# Patient Record
Sex: Male | Born: 1960 | Hispanic: No | Marital: Married | Smoking: Never smoker
Health system: Southern US, Community
[De-identification: ages and names within clinical notes are randomized; demographics above are authoritative.]

## PROBLEM LIST (undated history)

## (undated) DIAGNOSIS — T7840XA Allergy, unspecified, initial encounter: Secondary | ICD-10-CM

## (undated) DIAGNOSIS — E785 Hyperlipidemia, unspecified: Secondary | ICD-10-CM

## (undated) DIAGNOSIS — R0683 Snoring: Secondary | ICD-10-CM

## (undated) HISTORY — DX: Allergy, unspecified, initial encounter: T78.40XA

## (undated) HISTORY — DX: Snoring: R06.83

## (undated) HISTORY — DX: Hyperlipidemia, unspecified: E78.5

---

## 2007-09-18 ENCOUNTER — Ambulatory Visit: Payer: Self-pay | Admitting: Internal Medicine

## 2007-09-18 DIAGNOSIS — J310 Chronic rhinitis: Secondary | ICD-10-CM

## 2007-09-18 DIAGNOSIS — B351 Tinea unguium: Secondary | ICD-10-CM

## 2007-09-29 ENCOUNTER — Ambulatory Visit: Payer: Self-pay | Admitting: Internal Medicine

## 2007-09-29 LAB — CONVERTED CEMR LAB
ALT: 25 units/L (ref 0–53)
Basophils Absolute: 0 10*3/uL (ref 0.0–0.1)
Basophils Relative: 0.7 % (ref 0.0–1.0)
Bilirubin Urine: NEGATIVE
CO2: 30 meq/L (ref 19–32)
Calcium: 9.2 mg/dL (ref 8.4–10.5)
Cholesterol: 195 mg/dL (ref 0–200)
Creatinine, Ser: 0.8 mg/dL (ref 0.4–1.5)
GFR calc Af Amer: 132 mL/min
HCT: 42.2 % (ref 39.0–52.0)
HDL: 45 mg/dL (ref >39.0)
Hemoglobin: 14.7 g/dL (ref 13.0–17.0)
Ketones, ur: NEGATIVE mg/dL
Lymphocytes Relative: 30.2 % (ref 12.0–46.0)
MCHC: 34.9 g/dL (ref 30.0–36.0)
MCV: 90.7 fL (ref 78.0–100.0)
Monocytes Absolute: 0.5 10*3/uL (ref 0.1–1.0)
Neutro Abs: 3.7 10*3/uL (ref 1.4–7.7)
RBC: 4.65 M/uL (ref 4.22–5.81)
RDW: 11.8 % (ref 11.5–14.6)
Sodium: 140 meq/L (ref 135–145)
Specific Gravity, Urine: 1.025 (ref 1.000–1.03)
TSH: 1.27 microintl units/mL (ref 0.35–5.50)
Total Bilirubin: 0.9 mg/dL (ref 0.3–1.2)
Total CHOL/HDL Ratio: 4.3
Total Protein, Urine: NEGATIVE mg/dL
Total Protein: 7.7 g/dL (ref 6.0–8.3)
Triglycerides: 84 mg/dL (ref 0–149)
Urine Glucose: NEGATIVE mg/dL
pH: 6 (ref 5.0–8.0)

## 2008-04-01 ENCOUNTER — Ambulatory Visit: Payer: Self-pay | Admitting: Internal Medicine

## 2008-04-01 DIAGNOSIS — L659 Nonscarring hair loss, unspecified: Secondary | ICD-10-CM | POA: Insufficient documentation

## 2008-04-01 DIAGNOSIS — E786 Lipoprotein deficiency: Secondary | ICD-10-CM | POA: Insufficient documentation

## 2008-04-01 DIAGNOSIS — J309 Allergic rhinitis, unspecified: Secondary | ICD-10-CM | POA: Insufficient documentation

## 2008-10-21 ENCOUNTER — Ambulatory Visit: Payer: Self-pay | Admitting: Internal Medicine

## 2008-10-21 LAB — CONVERTED CEMR LAB
BUN: 17 mg/dL (ref 6–23)
Basophils Absolute: 0 10*3/uL (ref 0.0–0.1)
Bilirubin, Direct: 0.2 mg/dL (ref 0.0–0.3)
CO2: 31 meq/L (ref 19–32)
Calcium: 9.2 mg/dL (ref 8.4–10.5)
Chloride: 105 meq/L (ref 96–112)
Cholesterol: 204 mg/dL — ABNORMAL HIGH (ref 0–200)
Creatinine, Ser: 0.7 mg/dL (ref 0.4–1.5)
Eosinophils Absolute: 0.1 10*3/uL (ref 0.0–0.7)
Hemoglobin, Urine: NEGATIVE
MCHC: 35.1 g/dL (ref 30.0–36.0)
MCV: 90.8 fL (ref 78.0–100.0)
Monocytes Absolute: 0.6 10*3/uL (ref 0.1–1.0)
Neutrophils Relative %: 54.3 % (ref 43.0–77.0)
Nitrite: NEGATIVE
PSA: 0.71 ng/mL (ref 0.10–4.00)
Platelets: 233 10*3/uL (ref 150.0–400.0)
Specific Gravity, Urine: 1.015 (ref 1.000–1.030)
Total Bilirubin: 1.1 mg/dL (ref 0.3–1.2)
Total Protein, Urine: NEGATIVE mg/dL
Triglycerides: 109 mg/dL (ref 0.0–149.0)
VLDL: 21.8 mg/dL (ref 0.0–40.0)
pH: 7 (ref 5.0–8.0)

## 2009-03-29 ENCOUNTER — Encounter (INDEPENDENT_AMBULATORY_CARE_PROVIDER_SITE_OTHER): Payer: Self-pay | Admitting: *Deleted

## 2009-04-21 ENCOUNTER — Encounter: Payer: Self-pay | Admitting: Internal Medicine

## 2010-10-05 ENCOUNTER — Other Ambulatory Visit (INDEPENDENT_AMBULATORY_CARE_PROVIDER_SITE_OTHER): Payer: BC Managed Care – PPO

## 2010-10-05 ENCOUNTER — Encounter: Payer: Self-pay | Admitting: Internal Medicine

## 2010-10-05 DIAGNOSIS — Z0000 Encounter for general adult medical examination without abnormal findings: Secondary | ICD-10-CM

## 2010-10-05 DIAGNOSIS — Z0389 Encounter for observation for other suspected diseases and conditions ruled out: Secondary | ICD-10-CM

## 2010-10-05 LAB — CBC WITH DIFFERENTIAL/PLATELET
Basophils Absolute: 0 10*3/uL (ref 0.0–0.1)
Basophils Relative: 0.3 % (ref 0.0–3.0)
Eosinophils Relative: 1.7 % (ref 0.0–5.0)
HCT: 44.3 % (ref 39.0–52.0)
Hemoglobin: 15.2 g/dL (ref 13.0–17.0)
Lymphocytes Relative: 38.4 % (ref 12.0–46.0)
Lymphs Abs: 2.3 10*3/uL (ref 0.7–4.0)
Monocytes Relative: 10.8 % (ref 3.0–12.0)
Neutro Abs: 2.9 10*3/uL (ref 1.4–7.7)
RBC: 4.79 Mil/uL (ref 4.22–5.81)
RDW: 12.6 % (ref 11.5–14.6)
WBC: 6 10*3/uL (ref 4.5–10.5)

## 2010-10-05 LAB — BASIC METABOLIC PANEL
Calcium: 9.1 mg/dL (ref 8.4–10.5)
GFR: 104.74 mL/min (ref >60.00)
Glucose, Bld: 90 mg/dL (ref 70–99)
Potassium: 4.7 mEq/L (ref 3.5–5.1)
Sodium: 138 mEq/L (ref 135–145)

## 2010-10-05 LAB — HEPATIC FUNCTION PANEL
AST: 29 U/L (ref 0–37)
Albumin: 3.6 g/dL (ref 3.5–5.2)
Alkaline Phosphatase: 71 U/L (ref 39–117)
Bilirubin, Direct: 0 mg/dL (ref 0.0–0.3)

## 2010-10-05 LAB — LIPID PANEL
HDL: 49.6 mg/dL (ref >39.00)
Total CHOL/HDL Ratio: 4
VLDL: 15.4 mg/dL (ref 0.0–40.0)

## 2010-10-05 LAB — URINALYSIS
Ketones, ur: NEGATIVE
Specific Gravity, Urine: 1.02 (ref 1.000–1.030)
Total Protein, Urine: NEGATIVE
Urine Glucose: NEGATIVE
Urobilinogen, UA: 0.2 (ref 0.0–1.0)
pH: 7 (ref 5.0–8.0)

## 2010-10-05 LAB — TSH: TSH: 1.42 u[IU]/mL (ref 0.35–5.50)

## 2010-10-09 ENCOUNTER — Encounter: Payer: Self-pay | Admitting: Internal Medicine

## 2010-10-09 ENCOUNTER — Ambulatory Visit (INDEPENDENT_AMBULATORY_CARE_PROVIDER_SITE_OTHER): Payer: BC Managed Care – PPO | Admitting: Internal Medicine

## 2010-10-09 VITALS — BP 112/80 | HR 80 | Temp 97.3°F | Resp 16 | Ht 68.75 in | Wt 174.0 lb

## 2010-10-09 DIAGNOSIS — Z0000 Encounter for general adult medical examination without abnormal findings: Secondary | ICD-10-CM | POA: Insufficient documentation

## 2010-10-09 DIAGNOSIS — B001 Herpesviral vesicular dermatitis: Secondary | ICD-10-CM | POA: Insufficient documentation

## 2010-10-09 DIAGNOSIS — B009 Herpesviral infection, unspecified: Secondary | ICD-10-CM

## 2010-10-09 DIAGNOSIS — R1031 Right lower quadrant pain: Secondary | ICD-10-CM

## 2010-10-09 MED ORDER — CICLOPIROX 8 % EX SOLN: Freq: Every day | CUTANEOUS | Status: DC

## 2010-10-09 MED ORDER — ACYCLOVIR 400 MG PO TABS: 400.0000 mg | ORAL_TABLET | Freq: Three times a day (TID) | ORAL | Status: AC

## 2010-10-09 NOTE — Assessment & Plan Note (Signed)
Colonoscopy CBC nl

## 2010-10-09 NOTE — Progress Notes (Signed)
  Subjective:    Patient ID: Jeff Le, male    DOB: August 28, 1960, 50 y.o.   MRN: [id number]  HPI   The patient is here for a wellness exam. The patient has been doing well overall without major physical or psychological issues going on lately. C/o pain in RLQ x 6 wks off and on No fever C/o LBP  Review of Systems  Constitutional: Negative for appetite change, fatigue and unexpected weight change.  HENT: Negative for nosebleeds, congestion, sore throat, sneezing, trouble swallowing and neck pain.   Eyes: Negative for itching and visual disturbance.  Respiratory: Negative for cough.   Cardiovascular: Negative for chest pain, palpitations and leg swelling.  Gastrointestinal: Negative for nausea, diarrhea, blood in stool and abdominal distention.  Genitourinary: Negative for frequency and hematuria.  Musculoskeletal: Negative for back pain, joint swelling and gait problem.  Skin: Negative for rash.  Neurological: Negative for dizziness, tremors, speech difficulty and weakness.  Psychiatric/Behavioral: Negative for sleep disturbance, dysphoric mood and agitation. The patient is not nervous/anxious.        Objective:   Physical Exam  Constitutional: He is oriented to person, place, and time. He appears well-developed and well-nourished. No distress.  HENT:  Head: Normocephalic and atraumatic.  Right Ear: External ear normal.  Left Ear: External ear normal.  Nose: Nose normal.  Mouth/Throat: Oropharynx is clear and moist. No oropharyngeal exudate.  Eyes: Conjunctivae and EOM are normal. Pupils are equal, round, and reactive to light. Right eye exhibits no discharge. Left eye exhibits no discharge. No scleral icterus.  Neck: Normal range of motion. Neck supple. No JVD present. No tracheal deviation present. No thyromegaly present.  Cardiovascular: Normal rate, regular rhythm, normal heart sounds and intact distal pulses.  Exam reveals no gallop and no friction rub.   No murmur  heard. Pulmonary/Chest: Effort normal and breath sounds normal. No stridor. No respiratory distress. He has no wheezes. He has no rales. He exhibits no tenderness.  Abdominal: Soft. Bowel sounds are normal. He exhibits no distension and no mass. There is no tenderness. There is no rebound and no guarding.  Genitourinary: Rectum normal, prostate normal and penis normal. Guaiac negative stool. No penile tenderness.  Musculoskeletal: Normal range of motion. He exhibits no edema and no tenderness.  Lymphadenopathy:    He has no cervical adenopathy.  Neurological: He is alert and oriented to person, place, and time. He has normal reflexes. No cranial nerve deficit. He exhibits normal muscle tone. Coordination normal.  Skin: Skin is warm and dry. No rash noted. He is not diaphoretic. No erythema. No pallor.  Psychiatric: He has a normal mood and affect. His behavior is normal. Judgment and thought content normal.        Lab Results  Component Value Date   WBC 6.0 10/05/2010   HGB 15.2 10/05/2010   HCT 44.3 10/05/2010   PLT 244.0 10/05/2010   CHOL 183 10/05/2010   TRIG 77.0 10/05/2010   HDL 49.60 10/05/2010   LDLDIRECT 125.4 10/21/2008   ALT 27 10/05/2010   AST 29 10/05/2010   NA 138 10/05/2010   K 4.7 10/05/2010   CL 104 10/05/2010   CREATININE 0.8 10/05/2010   BUN 18 10/05/2010   CO2 30 10/05/2010   TSH 1.42 10/05/2010   PSA 0.76 10/05/2010     Assessment & Plan:

## 2010-10-09 NOTE — Assessment & Plan Note (Signed)
We discussed age appropriate health related issues, including available/recomended screening tests and vaccinations. We discussed a need for adhering to healthy diet and exercise. Labs/EKG were reviewed/ordered. All questions were answered.   

## 2010-10-09 NOTE — Assessment & Plan Note (Signed)
Start Acyclovir prn

## 2010-11-09 ENCOUNTER — Encounter: Payer: Self-pay | Admitting: Internal Medicine

## 2010-12-20 ENCOUNTER — Encounter: Payer: Self-pay | Admitting: Internal Medicine

## 2011-08-24 ENCOUNTER — Encounter (HOSPITAL_COMMUNITY): Payer: Self-pay | Admitting: *Deleted

## 2011-08-24 ENCOUNTER — Emergency Department (HOSPITAL_COMMUNITY)
Admission: EM | Admit: 2011-08-24 | Discharge: 2011-08-24 | Disposition: A | Discharge status: Home / Self Care | Payer: BC Managed Care – PPO | Attending: Emergency Medicine | Admitting: Emergency Medicine

## 2011-08-24 DIAGNOSIS — S41109A Unspecified open wound of unspecified upper arm, initial encounter: Secondary | ICD-10-CM | POA: Insufficient documentation

## 2011-08-24 DIAGNOSIS — W540XXA Bitten by dog, initial encounter: Secondary | ICD-10-CM | POA: Insufficient documentation

## 2011-08-24 DIAGNOSIS — Y92009 Unspecified place in unspecified non-institutional (private) residence as the place of occurrence of the external cause: Secondary | ICD-10-CM | POA: Insufficient documentation

## 2011-08-24 MED ORDER — AMOXICILLIN-POT CLAVULANATE 875-125 MG PO TABS: 1.0000 | ORAL_TABLET | Freq: Two times a day (BID) | ORAL | Status: AC

## 2011-08-24 MED ORDER — TETANUS-DIPHTH-ACELL PERTUSSIS 5-2.5-18.5 LF-MCG/0.5 IM SUSP
0.5000 mL | Freq: Once | INTRAMUSCULAR | Status: AC
  Administered 2011-08-24: 0.5 mL via INTRAMUSCULAR
  Filled 2011-08-24: qty 0.5

## 2011-08-24 NOTE — ED Notes (Signed)
Rx given x1 D/c instructions reviewed w/ pt - pt denies any further questions or concerns at present.   

## 2011-08-24 NOTE — Discharge Instructions (Signed)
You have been treated for your dog bite here today. Animal Control will keep the dog in quarantine for the next 7-10 days and give you a call if you need to get rabies shots. Your tetanus has been updated today. Please take all of the antibiotic as prescribed. Keep the wound clean and dry and covered with a bandage. Use neosporin or bacitracin to the area. If you are noticing redness or drainage around the area or otherwise worsening condition, return for a recheck.  RESOURCE GUIDE  Dental Problems  Patients with Medicaid: [facility] [phone]                                           [phone] [address] Phone:  [phone]                                                  Phone:  [phone]  If unable to pay or uninsured, contact:  [facility] or [facility]. to become qualified for the adult dental clinic.  Chronic Pain Problems Contact [facility]  [phone] Patients need to be referred by their primary care doctor.  Insufficient Money for Medicine Contact [employer]:  call "211" or [facility] 7821634048.  No Primary Care Doctor Call [facility]  [phone] Other agencies that provide inexpensive medical care    [facility]  [phone]    [facility]  [phone]    [facility]  [phone]    [facility]  [phone]    [facility]  [phone]    [facility]  [phone]  Psychological Services [facility] Behavioral Health  [phone] [facility] Services  [phone] [facility]   [phone] (emergency services [phone])  Substance Abuse Resources [facility]  [phone] [facility] [phone] [facility] [phone] Floydene Flock [phone] Residential & Outpatient Substance Abuse Program  [phone]  Abuse/Neglect [facility] Child Abuse Hotline 618-018-7870 [facility] Child Abuse Hotline 904-303-2617 (After Hours)  Emergency Shelter [facility] 806-367-4681  M[facility] Homes [facility] [place] [facility] 203-642-6914 [facility] [phone]  MRSA Hotline #:   380 119 4020    [facility] Resources  [facility]     [employer]                          [facility] Dept. [address] [place]                       [address]      [address]  Patrecia Pace  Michell Heinrich Phone:  [phone]                                   Phone:  [phone]                 Phone:  [phone]  [facility] Mental Health Phone:  [phone]  [facility] Child Abuse Hotline 779 546 0538 (915) [phone] (After Hours)  Animal Bite An animal bite can result in a scratch on the skin, deep open cut, puncture of the skin, crush injury, or tearing away of the skin or a body part. Dogs are responsible for most animal bites. Children are bitten more often than adults. An animal bite can range from very mild to more serious. A small bite from your house pet is no cause for alarm. However, some animal bites can become infected or injure a bone or other tissue. You must seek medical care if:  The skin is broken and bleeding does not slow down or stop after 15 minutes.   The puncture is deep and difficult to clean (such as a cat bite).   Pain, warmth, redness, or pus develops around the wound.   The bite is from a stray animal or rodent. There may be a risk of rabies infection.   The bite is from a snake, raccoon, skunk, fox, coyote, or bat. There may be a risk of rabies infection.   The person bitten has a chronic illness such as diabetes, liver disease, or cancer, or the person takes medicine that lowers the immune system.   There is concern about the location and severity of the  bite.  It is important to clean and protect an animal bite wound right away to prevent infection. Follow these steps:  Clean the wound with plenty of water and soap.   Apply an antibiotic cream.   Apply gentle pressure over the wound with a clean towel or gauze to slow or stop bleeding.   Elevate the affected area above the heart to help stop any bleeding.   Seek medical care. Getting medical care within 8 hours of the animal bite leads to the best possible outcome.  DIAGNOSIS  Your caregiver will most likely:  Take a detailed history of the animal and the bite injury.   Perform a wound exam.   Take your medical history.  Blood tests or X-rays may be performed. Sometimes, infected bite wounds are cultured and sent to a lab to identify the infectious bacteria.  TREATMENT  Medical treatment will depend on the location and type of animal bite as well as the patient's medical history. Treatment may include:  Wound care, such as cleaning and flushing the wound with saline solution, bandaging, and elevating the affected area.   Antibiotics.   Tetanus immunization.   Rabies immunization.   Leaving the wound open to heal. This is often done with animal bites, due to the high risk of infection. However, in certain cases, wound closure with stitches, wound adhesive, skin adhesive strips, or staples may be used.  Infected bites that are left untreated may require intravenous (IV) antibiotics and surgical treatment in the hospital. HOME CARE INSTRUCTIONS  Follow your caregiver's instructions for wound care.   Take all medicines as directed.   If your caregiver prescribes antibiotics, take them as directed. Finish them even if you start to feel better.   Follow up with your caregiver for further exams  or immunizations as directed.  You may need a tetanus shot if:  You cannot remember when you had your last tetanus shot.   You have never had a tetanus shot.   The injury broke your  skin.  If you get a tetanus shot, your arm may swell, get red, and feel warm to the touch. This is common and not a problem. If you need a tetanus shot and you choose not to have one, there is a rare chance of getting tetanus. Sickness from tetanus can be serious. SEEK MEDICAL CARE IF:  You notice warmth, redness, soreness, swelling, pus discharge, or a bad smell coming from the wound.   You have a red line on the skin coming from the wound.   You have a fever, chills, or a general ill feeling.   You have nausea or vomiting.   You have continued or worsening pain.   You have trouble moving the injured part.   You have other questions or concerns.  MAKE SURE YOU:  Understand these instructions.   Will watch your condition.   Will get help right away if you are not doing well or get worse.  Document Released: 01/22/2011 Document Revised: 04/25/2011 Document Reviewed: 01/22/2011 [facility] Patient Information 2012 [place], [place].

## 2011-08-24 NOTE — ED Notes (Signed)
Pt reports approx 2030 - his neighbors dog bit him while he was walking down the street - bite is reported to be unproved - pt w/ puncture wound and scrap to left upper arm w/ a surrounding contusion. Pt unsure if dog is vaccinated however states animal control and police were contacted and dog is now quarantined. Pt denies any pain at present.

## 2011-08-24 NOTE — ED Notes (Signed)
The pt was bitten by a dog earlier today to his lt upper arm.  Is has been reported

## 2011-08-24 NOTE — ED Provider Notes (Signed)
History     CSN: [id number]  Arrival date & time 08/24/11  2121   First MD Initiated Contact with Patient 08/24/11 2234      Chief Complaint  Patient presents with  . Animal Bite    (Consider location/radiation/quality/duration/timing/severity/associated sxs/prior treatment) HPI History from patient. 51 year old male who presents after suffering a dog bite. He states that he was outside his house around 8:30 this evening when his neighbor walked by with her dog. She lost control of the dog's leash and the dog got loose. It ran toward him and bit him on the left upper arm. Bleeding was well controlled. He denies any numbness or weakness distally and has full range of motion in the arm. Last tetanus status unknown. He has not taken anything prior to arrival.   He states that animal control did take the dog, and will be observing it for the next several days - the owner stated it had been vaccinated but did not have proper documentation of this.  History reviewed. No pertinent past medical history.  History reviewed. No pertinent past surgical history.  No family history on file.  History  Substance Use Topics  . Smoking status: Never Smoker   . Smokeless tobacco: Not on file  . Alcohol Use: No      Review of Systems  Constitutional: Negative for fever, chills, activity change and appetite change.  Musculoskeletal: Negative for myalgias and arthralgias.  Skin: Positive for wound.  Neurological: Negative for weakness and numbness.  Hematological: Does not bruise/bleed easily.    Allergies  Review of patient's allergies indicates no known allergies.  Home Medications  No current outpatient prescriptions on file.  BP 122/87  Pulse 76  Temp(Src) 99.5 F (37.5 C) (Oral)  Resp 18  Wt 154 lb 5.2 oz (70 kg)  SpO2 98%  Physical Exam  Nursing note and vitals reviewed. Constitutional: He appears well-developed and well-nourished. No distress.  HENT:  Head: Normocephalic  and atraumatic.  Musculoskeletal:       Arms:      Full range of motion of left arm. Strength is 5 out of 5 on resisted flexion and extension of the elbow. Extremity is neurovascularly intact with sensory intact to light touch in the radian, median, ulnar distributions. Radial pulse intact.  Skin: Skin is warm and dry. He is not diaphoretic.       Approximately 0.75 cm puncture wound to medial upper arm with exposed subcutaneous tissue. There is a superficial laceration measuring approximately 7 cm in length which is discrete from the puncture wound and extends laterally. Bleeding is controlled. There is slight edema to the area and early ecchymoses. Wound appears clean.    ED Course  Procedures (including critical care time)  Labs Reviewed - No data to display No results found.   1. Dog bite of arm       MDM  Patient presents after suffering a dog bite. This has already been reported to animal control, which has quarantine the animal. Patient's wound was extensively cleaned with high-pressure syringe irrigation with NS which he tolerated well. Will allow the puncture wound to close by secondary intention. Discussed wound care. Tetanus updated. Prescription for Augmentin given for antibiotic coverage. He is aware that he will be contacted if he needs postexposure prophylaxis. Return precautions discussed.        Grant Fontana, [place] 08/25/11 [phone]

## 2011-08-26 ENCOUNTER — Encounter: Payer: Self-pay | Admitting: Internal Medicine

## 2011-10-24 NOTE — ED Provider Notes (Signed)
Medical screening examination/treatment/procedure(s) were performed by non-physician practitioner and as supervising physician I was immediately available for consultation/collaboration.  Doug Sou, MD 10/24/11 [phone]

## 2011-12-31 ENCOUNTER — Telehealth: Payer: Self-pay

## 2011-12-31 DIAGNOSIS — Z0000 Encounter for general adult medical examination without abnormal findings: Secondary | ICD-10-CM

## 2011-12-31 NOTE — Telephone Encounter (Signed)
CPX labs entered in Epic 

## 2011-12-31 NOTE — Telephone Encounter (Signed)
Message copied by Sandi Mealy on Tue Dec 31, 2011 10:26 AM ------      Message from: Burnett Harry      Created: Wed Dec 25, 2011 10:12 AM      Regarding: CPE 03/23/2012       [facility]

## 2012-03-23 ENCOUNTER — Encounter: Payer: Self-pay | Admitting: Internal Medicine

## 2012-03-23 ENCOUNTER — Other Ambulatory Visit (INDEPENDENT_AMBULATORY_CARE_PROVIDER_SITE_OTHER): Payer: BC Managed Care – PPO

## 2012-03-23 ENCOUNTER — Ambulatory Visit (INDEPENDENT_AMBULATORY_CARE_PROVIDER_SITE_OTHER): Payer: BC Managed Care – PPO | Admitting: Internal Medicine

## 2012-03-23 ENCOUNTER — Other Ambulatory Visit: Payer: Self-pay | Admitting: *Deleted

## 2012-03-23 VITALS — BP 112/80 | HR 84 | Temp 97.7°F | Resp 16 | Ht 68.0 in | Wt 171.0 lb

## 2012-03-23 DIAGNOSIS — Z0389 Encounter for observation for other suspected diseases and conditions ruled out: Secondary | ICD-10-CM

## 2012-03-23 DIAGNOSIS — Z0100 Encounter for examination of eyes and vision without abnormal findings: Secondary | ICD-10-CM

## 2012-03-23 DIAGNOSIS — E786 Lipoprotein deficiency: Secondary | ICD-10-CM

## 2012-03-23 DIAGNOSIS — Z1211 Encounter for screening for malignant neoplasm of colon: Secondary | ICD-10-CM

## 2012-03-23 DIAGNOSIS — Z0000 Encounter for general adult medical examination without abnormal findings: Secondary | ICD-10-CM

## 2012-03-23 DIAGNOSIS — Z23 Encounter for immunization: Secondary | ICD-10-CM

## 2012-03-23 LAB — BASIC METABOLIC PANEL
BUN: 21 mg/dL (ref 6–23)
Chloride: 101 mEq/L (ref 96–112)
GFR: 98.58 mL/min (ref >60.00)
Potassium: 4.9 mEq/L (ref 3.5–5.1)
Sodium: 137 mEq/L (ref 135–145)

## 2012-03-23 LAB — CBC WITH DIFFERENTIAL/PLATELET
Basophils Relative: 0.2 % (ref 0.0–3.0)
Eosinophils Relative: 2.6 % (ref 0.0–5.0)
HCT: 44.9 % (ref 39.0–52.0)
Lymphs Abs: 2.1 10*3/uL (ref 0.7–4.0)
Monocytes Relative: 12.1 % — ABNORMAL HIGH (ref 3.0–12.0)
Neutrophils Relative %: 49.8 % (ref 43.0–77.0)
Platelets: 231 10*3/uL (ref 150.0–400.0)
RBC: 4.87 Mil/uL (ref 4.22–5.81)
WBC: 5.9 10*3/uL (ref 4.5–10.5)

## 2012-03-23 LAB — LIPID PANEL
Cholesterol: 214 mg/dL — ABNORMAL HIGH (ref 0–200)
Total CHOL/HDL Ratio: 4
Triglycerides: 140 mg/dL (ref 0.0–149.0)
VLDL: 28 mg/dL (ref 0.0–40.0)

## 2012-03-23 LAB — URINALYSIS, ROUTINE W REFLEX MICROSCOPIC
Bilirubin Urine: NEGATIVE
Hgb urine dipstick: NEGATIVE
Ketones, ur: NEGATIVE
Nitrite: NEGATIVE
Total Protein, Urine: NEGATIVE
Urine Glucose: NEGATIVE
pH: 7.5 (ref 5.0–8.0)

## 2012-03-23 LAB — HEPATIC FUNCTION PANEL
ALT: 29 U/L (ref 0–53)
AST: 29 U/L (ref 0–37)
Alkaline Phosphatase: 70 U/L (ref 39–117)
Bilirubin, Direct: 0.1 mg/dL (ref 0.0–0.3)
Total Bilirubin: 0.7 mg/dL (ref 0.3–1.2)

## 2012-03-23 LAB — LDL CHOLESTEROL, DIRECT: Direct LDL: 136.4 mg/dL

## 2012-03-23 MED ORDER — CICLOPIROX 8 % EX SOLN: Freq: Every day | CUTANEOUS | Status: DC

## 2012-03-23 NOTE — Progress Notes (Signed)
Subjective:    Patient ID: Jeff Le, male    DOB: Sep 18, 1960, 51 y.o.   MRN: [id number]  HPI   The patient is here for a wellness exam. The patient has been doing well overall without major physical or psychological issues going on lately.    BP Readings from Last 3 Encounters:  03/23/12 112/80  08/24/11 121/84  10/09/10 112/80   Wt Readings from Last 3 Encounters:  03/23/12 171 lb (77.565 kg)  08/24/11 154 lb 5.2 oz (70 kg)  10/09/10 174 lb (78.926 kg)       Review of Systems  Constitutional: Negative for appetite change, fatigue and unexpected weight change.  HENT: Negative for nosebleeds, congestion, sore throat, sneezing, trouble swallowing and neck pain.   Eyes: Negative for itching and visual disturbance.  Respiratory: Negative for cough.   Cardiovascular: Negative for chest pain, palpitations and leg swelling.  Gastrointestinal: Negative for nausea, diarrhea, blood in stool and abdominal distention.  Genitourinary: Negative for frequency and hematuria.  Musculoskeletal: Negative for back pain, joint swelling and gait problem.  Skin: Negative for rash.  Neurological: Negative for dizziness, tremors, speech difficulty and weakness.  Psychiatric/Behavioral: Negative for sleep disturbance, dysphoric mood and agitation. The patient is not nervous/anxious.        Objective:   Physical Exam  Constitutional: He is oriented to person, place, and time. He appears well-developed and well-nourished. No distress.  HENT:  Head: Normocephalic and atraumatic.  Right Ear: External ear normal.  Left Ear: External ear normal.  Nose: Nose normal.  Mouth/Throat: Oropharynx is clear and moist. No oropharyngeal exudate.  Eyes: Conjunctivae normal and EOM are normal. Pupils are equal, round, and reactive to light. Right eye exhibits no discharge. Left eye exhibits no discharge. No scleral icterus.  Neck: Normal range of motion. Neck supple. No JVD present. No  tracheal deviation present. No thyromegaly present.  Cardiovascular: Normal rate, regular rhythm, normal heart sounds and intact distal pulses.  Exam reveals no gallop and no friction rub.   No murmur heard. Pulmonary/Chest: Effort normal and breath sounds normal. No stridor. No respiratory distress. He has no wheezes. He has no rales. He exhibits no tenderness.  Abdominal: Soft. Bowel sounds are normal. He exhibits no distension and no mass. There is no tenderness. There is no rebound and no guarding.  Genitourinary:       He declined the exam (def to GI)  Musculoskeletal: Normal range of motion. He exhibits no edema and no tenderness.  Lymphadenopathy:    He has no cervical adenopathy.  Neurological: He is alert and oriented to person, place, and time. He has normal reflexes. No cranial nerve deficit. He exhibits normal muscle tone. Coordination normal.  Skin: Skin is warm and dry. No rash noted. He is not diaphoretic. No erythema. No pallor.  Psychiatric: He has a normal mood and affect. His behavior is normal. Judgment and thought content normal.        Lab Results  Component Value Date   WBC 6.0 10/05/2010   HGB 15.2 10/05/2010   HCT 44.3 10/05/2010   PLT 244.0 10/05/2010   CHOL 183 10/05/2010   TRIG 77.0 10/05/2010   HDL 49.60 10/05/2010   LDLDIRECT 125.4 10/21/2008   ALT 27 10/05/2010   AST 29 10/05/2010   NA 138 10/05/2010   K 4.7 10/05/2010   CL 104 10/05/2010   CREATININE 0.8 10/05/2010   BUN 18 10/05/2010   CO2 30 10/05/2010   TSH 1.42 10/05/2010  PSA 0.76 10/05/2010     Assessment & Plan:

## 2012-06-22 ENCOUNTER — Telehealth: Payer: Self-pay | Admitting: Internal Medicine

## 2012-06-22 NOTE — Telephone Encounter (Signed)
Requesting a referral for a colonoscopy.

## 2012-06-23 ENCOUNTER — Encounter: Payer: Self-pay | Admitting: Gastroenterology

## 2012-06-23 NOTE — Telephone Encounter (Signed)
Referral is already in.  Called the patient to give him the number to GI to call for an appt.

## 2012-06-25 ENCOUNTER — Encounter: Payer: Self-pay | Admitting: Internal Medicine

## 2012-08-06 ENCOUNTER — Encounter: Payer: BC Managed Care – PPO | Admitting: Gastroenterology

## 2013-01-25 ENCOUNTER — Ambulatory Visit (INDEPENDENT_AMBULATORY_CARE_PROVIDER_SITE_OTHER): Payer: Managed Care, Other (non HMO) | Admitting: Internal Medicine

## 2013-01-25 ENCOUNTER — Other Ambulatory Visit (INDEPENDENT_AMBULATORY_CARE_PROVIDER_SITE_OTHER): Payer: Managed Care, Other (non HMO)

## 2013-01-25 ENCOUNTER — Encounter: Payer: Self-pay | Admitting: Internal Medicine

## 2013-01-25 ENCOUNTER — Ambulatory Visit (INDEPENDENT_AMBULATORY_CARE_PROVIDER_SITE_OTHER)
Admission: RE | Admit: 2013-01-25 | Discharge: 2013-01-25 | Disposition: A | Discharge status: Home / Self Care | Payer: Managed Care, Other (non HMO) | Source: Ambulatory Visit | Attending: Internal Medicine | Admitting: Internal Medicine

## 2013-01-25 VITALS — BP 128/90 | HR 64 | Temp 97.2°F | Wt 172.0 lb

## 2013-01-25 DIAGNOSIS — R220 Localized swelling, mass and lump, head: Secondary | ICD-10-CM

## 2013-01-25 DIAGNOSIS — R221 Localized swelling, mass and lump, neck: Secondary | ICD-10-CM

## 2013-01-25 DIAGNOSIS — M542 Cervicalgia: Secondary | ICD-10-CM | POA: Insufficient documentation

## 2013-01-25 LAB — BASIC METABOLIC PANEL
BUN: 18 mg/dL (ref 6–23)
Calcium: 9.3 mg/dL (ref 8.4–10.5)
GFR: 109.99 mL/min (ref >60.00)
Glucose, Bld: 108 mg/dL — ABNORMAL HIGH (ref 70–99)

## 2013-01-25 LAB — CBC WITH DIFFERENTIAL/PLATELET
Basophils Absolute: 0 10*3/uL (ref 0.0–0.1)
Lymphocytes Relative: 30.4 % (ref 12.0–46.0)
Lymphs Abs: 1.9 10*3/uL (ref 0.7–4.0)
Monocytes Relative: 11.6 % (ref 3.0–12.0)
Neutrophils Relative %: 56.9 % (ref 43.0–77.0)
Platelets: 239 10*3/uL (ref 150.0–400.0)
RDW: 12.8 % (ref 11.5–14.6)

## 2013-01-25 MED ORDER — IOHEXOL 300 MG/ML  SOLN
75.0000 mL | Freq: Once | INTRAMUSCULAR | Status: AC | PRN
  Administered 2013-01-25: 75 mL via INTRAVENOUS

## 2013-01-25 NOTE — Assessment & Plan Note (Signed)
9/14 There is a longitudinal mass 1.5x6 cm over L paraspinal aspect of the L nuchal area MSK vs other

## 2013-01-25 NOTE — Assessment & Plan Note (Addendum)
There is a longitudinal mass/band 1.5x6 cm over L paraspinal aspect of the L nuchal area Labs CT scan

## 2013-01-25 NOTE — Progress Notes (Signed)
Patient ID: Jeff Le, male   DOB: 1961/05/19, 52 y.o.   MRN: [id number]  Subjective:    Patient ID: Jeff Le, male    DOB: [birth year], 52 y.o.   MRN: [id number]  Neck Pain  This is a new problem. The current episode started more than 1 month ago (since Feb; found a lump last wk). The problem occurs intermittently. Associated with: noted a mass on L posterior aspect. The quality of the pain is described as aching. The pain is at a severity of 3/10. The pain is mild. Pertinent negatives include no chest pain, trouble swallowing or weakness. He has tried nothing for the symptoms. The treatment provided no relief.      BP Readings from Last 3 Encounters:  01/25/13 128/90  03/23/12 112/80  08/24/11 121/84   Wt Readings from Last 3 Encounters:  01/25/13 172 lb (78.019 kg)  03/23/12 171 lb (77.565 kg)  08/24/11 154 lb 5.2 oz (70 kg)       Review of Systems  Constitutional: Negative for appetite change, fatigue and unexpected weight change.  HENT: Positive for neck pain. Negative for nosebleeds, congestion, sore throat, sneezing and trouble swallowing.   Eyes: Negative for itching and visual disturbance.  Respiratory: Negative for cough.   Cardiovascular: Negative for chest pain, palpitations and leg swelling.  Gastrointestinal: Negative for nausea, diarrhea, blood in stool and abdominal distention.  Genitourinary: Negative for frequency and hematuria.  Musculoskeletal: Negative for back pain, joint swelling and gait problem.  Skin: Negative for rash.  Neurological: Negative for dizziness, tremors, speech difficulty and weakness.  Psychiatric/Behavioral: Negative for sleep disturbance, dysphoric mood and agitation. The patient is not nervous/anxious.        Objective:   Physical Exam  Constitutional: He is oriented to person, place, and time. He appears well-developed and well-nourished. No distress.  HENT:  Head: Normocephalic and  atraumatic.  Right Ear: External ear normal.  Left Ear: External ear normal.  Nose: Nose normal.  Mouth/Throat: Oropharynx is clear and moist. No oropharyngeal exudate.  Eyes: Conjunctivae and EOM are normal. Pupils are equal, round, and reactive to light. Right eye exhibits no discharge. Left eye exhibits no discharge. No scleral icterus.  Neck: Normal range of motion. Neck supple. No JVD present. No tracheal deviation present. No thyromegaly present.  Cardiovascular: Normal rate, regular rhythm, normal heart sounds and intact distal pulses.  Exam reveals no gallop and no friction rub.   No murmur heard. Pulmonary/Chest: Effort normal and breath sounds normal. No stridor. No respiratory distress. He has no wheezes. He has no rales. He exhibits no tenderness.  Abdominal: Soft. Bowel sounds are normal. He exhibits no distension and no mass. There is no tenderness. There is no rebound and no guarding.  Genitourinary:  He declined the exam (def to GI)  Musculoskeletal: Normal range of motion. He exhibits no edema and no tenderness.  Lymphadenopathy:    He has no cervical adenopathy.  Neurological: He is alert and oriented to person, place, and time. He has normal reflexes. No cranial nerve deficit. He exhibits normal muscle tone. Coordination normal.  Skin: Skin is warm and dry. No rash noted. He is not diaphoretic. No erythema. No pallor.  Psychiatric: He has a normal mood and affect. His behavior is normal. Judgment and thought content normal.  There is a longitudinal mass/mobile band or a couple bands of tissue 1.5x6 cm over L paraspinal aspect of the L nuchal area      Lab Results  Component Value Date   WBC 5.9 03/23/2012   HGB 15.2 03/23/2012   HCT 44.9 03/23/2012   PLT 231.0 03/23/2012   CHOL 214* 03/23/2012   TRIG 140.0 03/23/2012   HDL 50.90 03/23/2012   LDLDIRECT 136.4 03/23/2012   ALT 29 03/23/2012   AST 29 03/23/2012   NA 137 03/23/2012   K 4.9 03/23/2012   CL 101 03/23/2012    CREATININE 0.9 03/23/2012   BUN 21 03/23/2012   CO2 29 03/23/2012   TSH 1.00 03/23/2012   PSA 0.61 03/23/2012     Assessment & Plan:

## 2013-03-25 ENCOUNTER — Other Ambulatory Visit: Payer: Self-pay

## 2013-04-12 ENCOUNTER — Encounter: Payer: Self-pay | Admitting: Gastroenterology

## 2013-05-11 ENCOUNTER — Ambulatory Visit (AMBULATORY_SURGERY_CENTER): Payer: Managed Care, Other (non HMO)

## 2013-05-11 VITALS — Ht 68.9 in | Wt 167.5 lb

## 2013-05-11 DIAGNOSIS — Z1211 Encounter for screening for malignant neoplasm of colon: Secondary | ICD-10-CM

## 2013-05-11 MED ORDER — SUPREP BOWEL PREP KIT 17.5-3.13-1.6 GM/177ML PO SOLN: 1.0000 | Freq: Once | ORAL | Status: DC

## 2013-05-19 ENCOUNTER — Encounter: Payer: Self-pay | Admitting: Gastroenterology

## 2013-06-11 ENCOUNTER — Encounter: Payer: Self-pay | Admitting: Gastroenterology

## 2013-06-11 ENCOUNTER — Ambulatory Visit (AMBULATORY_SURGERY_CENTER): Payer: Managed Care, Other (non HMO) | Admitting: Gastroenterology

## 2013-06-11 VITALS — BP 105/70 | HR 53 | Temp 96.6°F | Resp 22 | Ht 68.0 in | Wt 167.0 lb

## 2013-06-11 DIAGNOSIS — K648 Other hemorrhoids: Secondary | ICD-10-CM

## 2013-06-11 DIAGNOSIS — D126 Benign neoplasm of colon, unspecified: Secondary | ICD-10-CM

## 2013-06-11 DIAGNOSIS — Z1211 Encounter for screening for malignant neoplasm of colon: Secondary | ICD-10-CM

## 2013-06-11 MED ORDER — SODIUM CHLORIDE 0.9 % IV SOLN: 500.0000 mL | INTRAVENOUS | Status: DC

## 2013-06-11 NOTE — Patient Instructions (Signed)
Impressions/recommendations:  Polyps (handout given) Diverticulosis (handout given) Hemorrhoids (handouts given)  High Fiber Diet (handout given)  Repeat colonoscopy pending pathology results.  YOU HAD AN ENDOSCOPIC PROCEDURE TODAY AT [facility]: Refer to the procedure report that was given to you for any specific questions about what was found during the examination.  If the procedure report does not answer your questions, please call your gastroenterologist to clarify.  If you requested that your care partner not be given the details of your procedure findings, then the procedure report has been included in a sealed envelope for you to review at your convenience later.  YOU SHOULD EXPECT: Some feelings of bloating in the abdomen. Passage of more gas than usual.  Walking can help get rid of the air that was put into your GI tract during the procedure and reduce the bloating. If you had a lower endoscopy (such as a colonoscopy or flexible sigmoidoscopy) you may notice spotting of blood in your stool or on the toilet paper. If you underwent a bowel prep for your procedure, then you may not have a normal bowel movement for a few days.  DIET: Your first meal following the procedure should be a light meal and then it is ok to progress to your normal diet.  A half-sandwich or bowl of soup is an example of a good first meal.  Heavy or fried foods are harder to digest and may make you feel nauseous or bloated.  Likewise meals heavy in dairy and vegetables can cause extra gas to form and this can also increase the bloating.  Drink plenty of fluids but you should avoid alcoholic beverages for 24 hours.  ACTIVITY: Your care partner should take you home directly after the procedure.  You should plan to take it easy, moving slowly for the rest of the day.  You can resume normal activity the day after the procedure however you should NOT DRIVE or use heavy machinery for 24 hours (because of the  sedation medicines used during the test).    SYMPTOMS TO REPORT IMMEDIATELY: A gastroenterologist can be reached at any hour.  During normal business hours, 8:30 AM to 5:00 PM Monday through Friday, call [phone].  After hours and on weekends, please call the GI answering service at [phone] who will take a message and have the physician on call contact you.   Following lower endoscopy (colonoscopy or flexible sigmoidoscopy):  Excessive amounts of blood in the stool  Significant tenderness or worsening of abdominal pains  Swelling of the abdomen that is new, acute  Fever of 100F or higher  FOLLOW UP: If any biopsies were taken you will be contacted by phone or by letter within the next 1-3 weeks.  Call your gastroenterologist if you have not heard about the biopsies in 3 weeks.  Our staff will call the home number listed on your records the next business day following your procedure to check on you and address any questions or concerns that you may have at that time regarding the information given to you following your procedure. This is a courtesy call and so if there is no answer at the home number and we have not heard from you through the emergency physician on call, we will assume that you have returned to your regular daily activities without incident.  SIGNATURES/CONFIDENTIALITY: You and/or your care partner have signed paperwork which will be entered into your electronic medical record.  These signatures attest to the fact  that that the information above on your After Visit Summary has been reviewed and is understood.  Full responsibility of the confidentiality of this discharge information lies with you and/or your care-partner.

## 2013-06-11 NOTE — Progress Notes (Signed)
Called to room to assist during endoscopic procedure.  Patient ID and intended procedure confirmed with present staff. Received instructions for my participation in the procedure from the performing physician.  

## 2013-06-11 NOTE — Op Note (Signed)
[place]  [address] [place], [id number]   COLONOSCOPY PROCEDURE REPORT  PATIENT: Jeff, Le  MR#: [id number] BIRTHDATE: 1960-10-01 , 11  yrs. old GENDER: Male ENDOSCOPIST: Inda Castle, MD REFERRED VH:QION Avel Sensor, M.D. PROCEDURE DATE:  06/11/2013 PROCEDURE:   Colonoscopy with snare polypectomy and Colonoscopy with cold biopsy polypectomy First Screening Colonoscopy - Avg.  risk and is 50 yrs.  old or older Yes.  Prior Negative Screening - Now for repeat screening. N/A  History of Adenoma - Now for follow-up colonoscopy & has been > or = to 3 yrs.  N/A  Polyps Removed Today? Yes. ASA CLASS:   Class I INDICATIONS:average risk screening. MEDICATIONS: MAC sedation, administered by CRNA and propofol (Diprivan) 200mg  IV  DESCRIPTION OF PROCEDURE:   After the risks benefits and alternatives of the procedure were thoroughly explained, informed consent was obtained.  A digital rectal exam revealed no abnormalities of the rectum.   The LB [id number]  endoscope was introduced through the anus and advanced to the cecum, which was identified by both the appendix and ileocecal valve. No adverse events experienced.   The quality of the prep was excellent using Suprep  The instrument was then slowly withdrawn as the colon was fully examined.      COLON FINDINGS: Two sessile polyps measuring 2-3 mm in size were found in the ascending colon.  A polypectomy was performed with cold forceps.   A sessile polyp measuring 3-4 mm in size was found in the proximal transverse colon.  A polypectomy was performed with a cold snare.  The resection was complete and the polyp tissue was completely retrieved.   Diverticulosis was noted.   Internal hemorrhoids were found.  Retroflexed views revealed no abnormalities. The time to cecum=1 minutes 53 seconds.  Withdrawal time=10 minutes 03 seconds.  The scope was withdrawn and the procedure  completed. COMPLICATIONS: There were no complications.  ENDOSCOPIC IMPRESSION: 1.   Two sessile polyps measuring 2-3 mm in size were found in the ascending colon; polypectomy was performed with cold forceps 2.   Sessile polyp measuring 3-4 mm in size was found in the proximal transverse colon; polypectomy was performed with a cold snare 3.   Diverticulosis was noted 4.   Internal hemorrhoids  RECOMMENDATIONS: If the polyp(s) removed today are proven to be adenomatous (pre-cancerous) polyps, you will need a repeat colonoscopy in 5 years.  Otherwise you should continue to follow colorectal cancer screening guidelines for "routine risk" patients with colonoscopy in 10 years.  You will receive a letter within 1-2 weeks with the results of your biopsy as well as final recommendations.  Please call my office if you have not received a letter after 3 weeks.   eSigned:  Inda Castle, MD 06/11/2013 10:09 AM   cc:   PATIENT NAME:  Jeff, Le MR#: [id number]

## 2013-06-14 ENCOUNTER — Telehealth: Payer: Self-pay | Admitting: *Deleted

## 2013-06-14 NOTE — Telephone Encounter (Signed)
  Follow up Call-  Call back number 06/11/2013  Post procedure Call Back phone  # [phone]  Permission to leave phone message Yes     Patient questions:  Do you have a fever, pain , or abdominal swelling? no Pain Score  0 *  Have you tolerated food without any problems? yes  Have you been able to return to your normal activities? yes  Do you have any questions about your discharge instructions: Diet   no Medications  no Follow up visit  no  Do you have questions or concerns about your Care? no  Actions: * If pain score is 4 or above: Physician/ provider Notified : none.

## 2013-06-15 ENCOUNTER — Encounter: Payer: Self-pay | Admitting: Gastroenterology

## 2013-06-16 ENCOUNTER — Encounter: Payer: Self-pay | Admitting: *Deleted

## 2013-06-22 ENCOUNTER — Encounter: Payer: Self-pay | Admitting: Internal Medicine

## 2013-06-29 ENCOUNTER — Encounter: Payer: Self-pay | Admitting: Internal Medicine

## 2013-09-16 ENCOUNTER — Encounter: Payer: Self-pay | Admitting: Internal Medicine

## 2013-09-20 ENCOUNTER — Encounter: Payer: Self-pay | Admitting: Internal Medicine

## 2013-09-24 ENCOUNTER — Encounter: Payer: Self-pay | Admitting: Internal Medicine

## 2013-09-24 ENCOUNTER — Other Ambulatory Visit: Payer: Self-pay | Admitting: Internal Medicine

## 2013-09-24 DIAGNOSIS — N469 Male infertility, unspecified: Secondary | ICD-10-CM

## 2013-11-15 ENCOUNTER — Ambulatory Visit (INDEPENDENT_AMBULATORY_CARE_PROVIDER_SITE_OTHER): Payer: Managed Care, Other (non HMO) | Admitting: Internal Medicine

## 2013-11-15 ENCOUNTER — Encounter: Payer: Self-pay | Admitting: Internal Medicine

## 2013-11-15 VITALS — BP 114/78 | HR 72 | Temp 98.2°F | Resp 16 | Ht 69.5 in | Wt 167.0 lb

## 2013-11-15 DIAGNOSIS — Q453 Other congenital malformations of pancreas and pancreatic duct: Secondary | ICD-10-CM

## 2013-11-15 DIAGNOSIS — J310 Chronic rhinitis: Secondary | ICD-10-CM

## 2013-11-15 DIAGNOSIS — Z0000 Encounter for general adult medical examination without abnormal findings: Secondary | ICD-10-CM

## 2013-11-15 NOTE — Progress Notes (Signed)
Pre visit review using our clinic review tool, if applicable. No additional management support is needed unless otherwise documented below in the visit note. 

## 2013-11-15 NOTE — Assessment & Plan Note (Signed)
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

## 2013-11-15 NOTE — Assessment & Plan Note (Signed)
We discussed age appropriate health related issues, including available/recomended screening tests and vaccinations. We discussed a need for adhering to healthy diet and exercise. Labs/EKG were reviewed/ordered. All questions were answered.   

## 2013-11-15 NOTE — Progress Notes (Signed)
Subjective:    HPI   The patient is here for a wellness exam. The patient has been doing well overall without major physical or psychological issues going on lately.  He was in [place] and was adm to hosp w/abd pain w/pancreatitis and pancreatic tumor.     BP Readings from Last 3 Encounters:  11/15/13 114/78  06/11/13 105/70  01/25/13 128/90   Wt Readings from Last 3 Encounters:  11/15/13 167 lb (75.751 kg)  06/11/13 167 lb (75.751 kg)  05/11/13 167 lb 8.8 oz (76 kg)       Review of Systems  Constitutional: Negative for appetite change, fatigue and unexpected weight change.  HENT: Negative for congestion, nosebleeds, sneezing, sore throat and trouble swallowing.   Eyes: Negative for itching and visual disturbance.  Respiratory: Negative for cough.   Cardiovascular: Negative for chest pain, palpitations and leg swelling.  Gastrointestinal: Negative for nausea, diarrhea, blood in stool and abdominal distention.  Genitourinary: Negative for frequency and hematuria.  Musculoskeletal: Negative for back pain, gait problem, joint swelling and neck pain.  Skin: Negative for rash.  Neurological: Negative for dizziness, tremors, speech difficulty and weakness.  Psychiatric/Behavioral: Negative for sleep disturbance, dysphoric mood and agitation. The patient is not nervous/anxious.        Objective:   Physical Exam  Constitutional: He is oriented to person, place, and time. He appears well-developed and well-nourished. No distress.  HENT:  Head: Normocephalic and atraumatic.  Right Ear: External ear normal.  Left Ear: External ear normal.  Nose: Nose normal.  Mouth/Throat: Oropharynx is clear and moist. No oropharyngeal exudate.  Eyes: Conjunctivae and EOM are normal. Pupils are equal, round, and reactive to light. Right eye exhibits no discharge. Left eye exhibits no discharge. No scleral icterus.  Neck: Normal range of motion. Neck supple. No JVD present. No tracheal  deviation present. No thyromegaly present.  Cardiovascular: Normal rate, regular rhythm, normal heart sounds and intact distal pulses.  Exam reveals no gallop and no friction rub.   No murmur heard. Pulmonary/Chest: Effort normal and breath sounds normal. No stridor. No respiratory distress. He has no wheezes. He has no rales. He exhibits no tenderness.  Abdominal: Soft. Bowel sounds are normal. He exhibits no distension and no mass. There is no tenderness. There is no rebound and no guarding.  Genitourinary:  He declined the exam (def to GI)  Musculoskeletal: Normal range of motion. He exhibits no edema and no tenderness.  Lymphadenopathy:    He has no cervical adenopathy.  Neurological: He is alert and oriented to person, place, and time. He has normal reflexes. No cranial nerve deficit. He exhibits normal muscle tone. Coordination normal.  Skin: Skin is warm and dry. No rash noted. He is not diaphoretic. No erythema. No pallor.  Psychiatric: He has a normal mood and affect. His behavior is normal. Judgment and thought content normal.        Lab Results  Component Value Date   WBC 6.4 01/25/2013   HGB 15.0 01/25/2013   HCT 44.1 01/25/2013   PLT 239.0 01/25/2013   CHOL 214* 03/23/2012   TRIG 140.0 03/23/2012   HDL 50.90 03/23/2012   LDLDIRECT 136.4 03/23/2012   ALT 29 03/23/2012   AST 29 03/23/2012   NA 139 01/25/2013   K 4.9 01/25/2013   CL 105 01/25/2013   CREATININE 0.8 01/25/2013   BUN 18 01/25/2013   CO2 30 01/25/2013   TSH 1.00 03/23/2012   PSA 0.61 03/23/2012  Outside records/MRI reviewed   Assessment & Plan:

## 2013-11-16 ENCOUNTER — Other Ambulatory Visit (INDEPENDENT_AMBULATORY_CARE_PROVIDER_SITE_OTHER): Payer: Managed Care, Other (non HMO)

## 2013-11-16 DIAGNOSIS — Z0000 Encounter for general adult medical examination without abnormal findings: Secondary | ICD-10-CM

## 2013-11-16 DIAGNOSIS — Q453 Other congenital malformations of pancreas and pancreatic duct: Secondary | ICD-10-CM

## 2013-11-16 LAB — CBC WITH DIFFERENTIAL/PLATELET
BASOS PCT: 0.2 % (ref 0.0–3.0)
Basophils Absolute: 0 10*3/uL (ref 0.0–0.1)
EOS ABS: 0.2 10*3/uL (ref 0.0–0.7)
Eosinophils Relative: 3.4 % (ref 0.0–5.0)
HCT: 42 % (ref 39.0–52.0)
HEMOGLOBIN: 14.4 g/dL (ref 13.0–17.0)
Lymphocytes Relative: 36.7 % (ref 12.0–46.0)
Lymphs Abs: 1.9 10*3/uL (ref 0.7–4.0)
MCHC: 34.3 g/dL (ref 30.0–36.0)
MCV: 90.5 fl (ref 78.0–100.0)
Monocytes Absolute: 0.6 10*3/uL (ref 0.1–1.0)
Monocytes Relative: 11.6 % (ref 3.0–12.0)
NEUTROS ABS: 2.5 10*3/uL (ref 1.4–7.7)
Neutrophils Relative %: 48.1 % (ref 43.0–77.0)
Platelets: 260 10*3/uL (ref 150.0–400.0)
RBC: 4.64 Mil/uL (ref 4.22–5.81)
RDW: 12.7 % (ref 11.5–15.5)
WBC: 5.1 10*3/uL (ref 4.0–10.5)

## 2013-11-16 LAB — H. PYLORI ANTIBODY, IGG: H Pylori IgG: NEGATIVE

## 2013-11-16 LAB — BASIC METABOLIC PANEL
BUN: 17 mg/dL (ref 6–23)
CO2: 29 meq/L (ref 19–32)
CREATININE: 0.8 mg/dL (ref 0.4–1.5)
Calcium: 9.3 mg/dL (ref 8.4–10.5)
Chloride: 102 mEq/L (ref 96–112)
GFR: 103.52 mL/min (ref >60.00)
Glucose, Bld: 105 mg/dL — ABNORMAL HIGH (ref 70–99)
POTASSIUM: 4.6 meq/L (ref 3.5–5.1)
Sodium: 136 mEq/L (ref 135–145)

## 2013-11-16 LAB — LIPID PANEL
CHOLESTEROL: 191 mg/dL (ref 0–200)
HDL: 49.8 mg/dL (ref >39.00)
LDL Cholesterol: 113 mg/dL — ABNORMAL HIGH (ref 0–99)
NONHDL: 141.2
Total CHOL/HDL Ratio: 4
Triglycerides: 142 mg/dL (ref 0.0–149.0)
VLDL: 28.4 mg/dL (ref 0.0–40.0)

## 2013-11-16 LAB — TSH: TSH: 1.47 u[IU]/mL (ref 0.35–4.50)

## 2013-11-16 LAB — URINALYSIS
Bilirubin Urine: NEGATIVE
Hgb urine dipstick: NEGATIVE
KETONES UR: NEGATIVE
Leukocytes, UA: NEGATIVE
Nitrite: NEGATIVE
PH: 6 (ref 5.0–8.0)
SPECIFIC GRAVITY, URINE: 1.015 (ref 1.000–1.030)
TOTAL PROTEIN, URINE-UPE24: NEGATIVE
UROBILINOGEN UA: 0.2 (ref 0.0–1.0)
Urine Glucose: NEGATIVE

## 2013-11-16 LAB — HEPATIC FUNCTION PANEL
ALT: 29 U/L (ref 0–53)
AST: 34 U/L (ref 0–37)
Albumin: 3.8 g/dL (ref 3.5–5.2)
Alkaline Phosphatase: 77 U/L (ref 39–117)
BILIRUBIN TOTAL: 0.4 mg/dL (ref 0.2–1.2)
Bilirubin, Direct: 0.1 mg/dL (ref 0.0–0.3)
Total Protein: 7.6 g/dL (ref 6.0–8.3)

## 2013-11-16 LAB — PSA: PSA: 0.8 ng/mL (ref 0.10–4.00)

## 2013-11-17 LAB — CEA: CEA: 1.2 ng/mL (ref 0.0–5.0)

## 2013-11-17 NOTE — Assessment & Plan Note (Signed)
Prn meds 

## 2014-01-14 ENCOUNTER — Encounter: Payer: Self-pay | Admitting: Internal Medicine

## 2014-01-18 ENCOUNTER — Other Ambulatory Visit: Payer: Self-pay | Admitting: Internal Medicine

## 2014-01-18 DIAGNOSIS — K648 Other hemorrhoids: Secondary | ICD-10-CM

## 2014-05-20 ENCOUNTER — Emergency Department (HOSPITAL_COMMUNITY): Payer: Managed Care, Other (non HMO)

## 2014-05-20 ENCOUNTER — Encounter (HOSPITAL_COMMUNITY): Payer: Self-pay | Admitting: Emergency Medicine

## 2014-05-20 ENCOUNTER — Emergency Department (HOSPITAL_COMMUNITY)
Admission: EM | Admit: 2014-05-20 | Discharge: 2014-05-20 | Disposition: A | Discharge status: Home / Self Care | Payer: Managed Care, Other (non HMO) | Attending: Emergency Medicine | Admitting: Emergency Medicine

## 2014-05-20 DIAGNOSIS — Q453 Other congenital malformations of pancreas and pancreatic duct: Secondary | ICD-10-CM

## 2014-05-20 DIAGNOSIS — Z8639 Personal history of other endocrine, nutritional and metabolic disease: Secondary | ICD-10-CM | POA: Insufficient documentation

## 2014-05-20 DIAGNOSIS — K868 Other specified diseases of pancreas: Secondary | ICD-10-CM | POA: Diagnosis not present

## 2014-05-20 DIAGNOSIS — R109 Unspecified abdominal pain: Secondary | ICD-10-CM

## 2014-05-20 DIAGNOSIS — R1013 Epigastric pain: Secondary | ICD-10-CM | POA: Diagnosis present

## 2014-05-20 LAB — LIPASE, BLOOD: Lipase: 40 U/L (ref 11–59)

## 2014-05-20 LAB — COMPREHENSIVE METABOLIC PANEL
ALT: 21 U/L (ref 0–53)
AST: 25 U/L (ref 0–37)
Albumin: 4.1 g/dL (ref 3.5–5.2)
Alkaline Phosphatase: 80 U/L (ref 39–117)
Anion gap: 10 (ref 5–15)
BUN: 19 mg/dL (ref 6–23)
CALCIUM: 9.2 mg/dL (ref 8.4–10.5)
CHLORIDE: 104 meq/L (ref 96–112)
CO2: 24 mmol/L (ref 19–32)
Creatinine, Ser: 0.85 mg/dL (ref 0.50–1.35)
GFR calc Af Amer: >90 mL/min (ref >90)
GFR calc non Af Amer: >90 mL/min (ref >90)
Glucose, Bld: 119 mg/dL — ABNORMAL HIGH (ref 70–99)
Potassium: 4.1 mmol/L (ref 3.5–5.1)
Sodium: 138 mmol/L (ref 135–145)
TOTAL PROTEIN: 8 g/dL (ref 6.0–8.3)
Total Bilirubin: 0.7 mg/dL (ref 0.3–1.2)

## 2014-05-20 LAB — URINALYSIS, ROUTINE W REFLEX MICROSCOPIC
Bilirubin Urine: NEGATIVE
Glucose, UA: NEGATIVE mg/dL
HGB URINE DIPSTICK: NEGATIVE
Ketones, ur: >80 mg/dL — AB
Leukocytes, UA: NEGATIVE
NITRITE: NEGATIVE
PH: 6 (ref 5.0–8.0)
PROTEIN: NEGATIVE mg/dL
Specific Gravity, Urine: 1.027 (ref 1.005–1.030)
Urobilinogen, UA: 0.2 mg/dL (ref 0.0–1.0)

## 2014-05-20 LAB — CBC WITH DIFFERENTIAL/PLATELET
BASOS PCT: 0 % (ref 0–1)
Basophils Absolute: 0 10*3/uL (ref 0.0–0.1)
Eosinophils Absolute: 0 10*3/uL (ref 0.0–0.7)
Eosinophils Relative: 0 % (ref 0–5)
HEMATOCRIT: 41.1 % (ref 39.0–52.0)
HEMOGLOBIN: 14.5 g/dL (ref 13.0–17.0)
Lymphocytes Relative: 9 % — ABNORMAL LOW (ref 12–46)
Lymphs Abs: 1.7 10*3/uL (ref 0.7–4.0)
MCH: 31.4 pg (ref 26.0–34.0)
MCHC: 35.3 g/dL (ref 30.0–36.0)
MCV: 89 fL (ref 78.0–100.0)
MONOS PCT: 9 % (ref 3–12)
Monocytes Absolute: 1.7 10*3/uL — ABNORMAL HIGH (ref 0.1–1.0)
NEUTROS ABS: 14.9 10*3/uL — AB (ref 1.7–7.7)
Neutrophils Relative %: 82 % — ABNORMAL HIGH (ref 43–77)
Platelets: 262 10*3/uL (ref 150–400)
RBC: 4.62 MIL/uL (ref 4.22–5.81)
RDW: 12.2 % (ref 11.5–15.5)
WBC: 18.2 10*3/uL — ABNORMAL HIGH (ref 4.0–10.5)

## 2014-05-20 MED ORDER — SODIUM CHLORIDE 0.9 % IV BOLUS (SEPSIS)
1000.0000 mL | Freq: Once | INTRAVENOUS | Status: AC
  Administered 2014-05-20: 1000 mL via INTRAVENOUS

## 2014-05-20 MED ORDER — HYDROMORPHONE HCL 1 MG/ML IJ SOLN
1.0000 mg | Freq: Once | INTRAMUSCULAR | Status: AC
  Administered 2014-05-20: 1 mg via INTRAVENOUS
  Filled 2014-05-20: qty 1

## 2014-05-20 MED ORDER — ONDANSETRON 8 MG PO TBDP: 8.0000 mg | ORAL_TABLET | Freq: Three times a day (TID) | ORAL | Status: DC | PRN

## 2014-05-20 MED ORDER — ONDANSETRON HCL 4 MG/2ML IJ SOLN
4.0000 mg | Freq: Once | INTRAMUSCULAR | Status: AC
  Administered 2014-05-20: 4 mg via INTRAVENOUS
  Filled 2014-05-20: qty 2

## 2014-05-20 MED ORDER — OXYCODONE-ACETAMINOPHEN 5-325 MG PO TABS: 1.0000 | ORAL_TABLET | Freq: Four times a day (QID) | ORAL | Status: DC | PRN

## 2014-05-20 MED ORDER — IOHEXOL 300 MG/ML  SOLN
100.0000 mL | Freq: Once | INTRAMUSCULAR | Status: AC | PRN
  Administered 2014-05-20: 100 mL via INTRAVENOUS

## 2014-05-20 NOTE — ED Provider Notes (Signed)
CSN: [id number]     Arrival date & time 05/20/14  1540 History   First MD Initiated Contact with Patient 05/20/14 1817     Chief Complaint  Patient presents with  . Abdominal Pain  . Nausea  . Emesis     (Consider location/radiation/quality/duration/timing/severity/associated sxs/prior Treatment) The history is provided by the patient.  pt c/o abdominal pain for 3 days. Pain initially located in epigastric and mid abd region, now most prominent in rlq. Pain constant, dull, moderate-sever.  No back or flank pain. Decreased appetite. No nvd. No constipation. No prior abd surgery.  States in past year, a few episodes of similar pain.  On one ct was noted to have thickened area in pancreas, but subsequent tests/gi eval, mrcps/mri have not shown any growth - no specific dx. No fever or chills. ?hx pancreatitis. No etoh use/abuse.      Past Medical History  Diagnosis Date  . Allergy     rhinitis  . Snoring   . Dyslipidemia     very mild   History reviewed. No pertinent past surgical history. Family History  Problem Relation Age of Onset  . Memory loss Mother 26  . Cancer Father 49    Esophageal  . Colon cancer Neg Hx    History  Substance Use Topics  . Smoking status: Never Smoker   . Smokeless tobacco: Not on file  . Alcohol Use: No    Review of Systems  Constitutional: Negative for fever and chills.  HENT: Negative for sore throat.   Eyes: Negative for redness.  Respiratory: Negative for shortness of breath.   Cardiovascular: Negative for chest pain.  Gastrointestinal: Negative for vomiting, abdominal pain, diarrhea and constipation.  Genitourinary: Negative for dysuria, flank pain, scrotal swelling and testicular pain.  Musculoskeletal: Negative for back pain and neck pain.  Skin: Negative for rash.  Neurological: Negative for headaches.  Hematological: Does not bruise/bleed easily.  Psychiatric/Behavioral: Negative for confusion.      Allergies  Review of  patient's allergies indicates no known allergies.  Home Medications   Prior to Admission medications   Medication Sig Start Date End Date Taking? Authorizing Provider  hyoscyamine (LEVSIN) 0.125 MG tablet Take 0.125 mg by mouth every 4 (four) hours as needed (abdominal pain).   Yes Historical Provider, MD  ondansetron (ZOFRAN) 8 MG tablet Take 8 mg by mouth every 8 (eight) hours as needed for nausea or vomiting (vomiting).   Yes Historical Provider, MD   BP 120/74 mmHg  Pulse 84  Temp(Src) 99.1 F (37.3 C) (Oral)  Resp 18  SpO2 99% Physical Exam  Constitutional: He is oriented to person, place, and time. He appears well-developed and well-nourished. No distress.  HENT:  Mouth/Throat: Oropharynx is clear and moist.  Eyes: Conjunctivae are normal. No scleral icterus.  Neck: Neck supple. No tracheal deviation present.  Cardiovascular: Normal rate, regular rhythm, normal heart sounds and intact distal pulses.   Pulmonary/Chest: Effort normal and breath sounds normal. No accessory muscle usage. No respiratory distress.  Abdominal: Soft. Bowel sounds are normal. He exhibits no distension and no mass. There is tenderness. There is no rebound and no guarding.  rlq tenderness. No incarc hernia.   Genitourinary:  No cva tenderness.   Musculoskeletal: Normal range of motion.  Neurological: He is alert and oriented to person, place, and time.  Skin: Skin is warm and dry. He is not diaphoretic.  Psychiatric: He has a normal mood and affect.  Nursing note and vitals reviewed.  ED Course  Procedures (including critical care time) Labs Review   Results for orders placed or performed during the hospital encounter of 05/20/14  CBC with Differential  Result Value Ref Range   WBC 18.2 (H) 4.0 - 10.5 K/uL   RBC 4.62 4.22 - 5.81 MIL/uL   Hemoglobin 14.5 13.0 - 17.0 g/dL   HCT 41.1 39.0 - 52.0 %   MCV 89.0 78.0 - 100.0 fL   MCH 31.4 26.0 - 34.0 pg   MCHC 35.3 30.0 - 36.0 g/dL   RDW 12.2  11.5 - 15.5 %   Platelets 262 150 - 400 K/uL   Neutrophils Relative % 82 (H) 43 - 77 %   Neutro Abs 14.9 (H) 1.7 - 7.7 K/uL   Lymphocytes Relative 9 (L) 12 - 46 %   Lymphs Abs 1.7 0.7 - 4.0 K/uL   Monocytes Relative 9 3 - 12 %   Monocytes Absolute 1.7 (H) 0.1 - 1.0 K/uL   Eosinophils Relative 0 0 - 5 %   Eosinophils Absolute 0.0 0.0 - 0.7 K/uL   Basophils Relative 0 0 - 1 %   Basophils Absolute 0.0 0.0 - 0.1 K/uL  Comprehensive metabolic panel  Result Value Ref Range   Sodium 138 135 - 145 mmol/L   Potassium 4.1 3.5 - 5.1 mmol/L   Chloride 104 96 - 112 mEq/L   CO2 24 19 - 32 mmol/L   Glucose, Bld 119 (H) 70 - 99 mg/dL   BUN 19 6 - 23 mg/dL   Creatinine, Ser 0.85 0.50 - 1.35 mg/dL   Calcium 9.2 8.4 - 10.5 mg/dL   Total Protein 8.0 6.0 - 8.3 g/dL   Albumin 4.1 3.5 - 5.2 g/dL   AST 25 0 - 37 U/L   ALT 21 0 - 53 U/L   Alkaline Phosphatase 80 39 - 117 U/L   Total Bilirubin 0.7 0.3 - 1.2 mg/dL   GFR calc non Af Amer >90 >90 mL/min   GFR calc Af Amer >90 >90 mL/min   Anion gap 10 5 - 15  Lipase, blood  Result Value Ref Range   Lipase 40 11 - 59 U/L  Urinalysis, Routine w reflex microscopic  Result Value Ref Range   Color, Urine YELLOW YELLOW   APPearance CLEAR CLEAR   Specific Gravity, Urine 1.027 1.005 - 1.030   pH 6.0 5.0 - 8.0   Glucose, UA NEGATIVE NEGATIVE mg/dL   Hgb urine dipstick NEGATIVE NEGATIVE   Bilirubin Urine NEGATIVE NEGATIVE   Ketones, ur >80 (A) NEGATIVE mg/dL   Protein, ur NEGATIVE NEGATIVE mg/dL   Urobilinogen, UA 0.2 0.0 - 1.0 mg/dL   Nitrite NEGATIVE NEGATIVE   Leukocytes, UA NEGATIVE NEGATIVE   Ct Abdomen Pelvis W Contrast  05/20/2014   CLINICAL DATA:  Right lower quadrant pain, nausea, vomiting and diarrhea. Three days duration.  EXAM: CT ABDOMEN AND PELVIS WITH CONTRAST  TECHNIQUE: Multidetector CT imaging of the abdomen and pelvis was performed using the standard protocol following bolus administration of intravenous contrast.  CONTRAST:  182mL  OMNIPAQUE IOHEXOL 300 MG/ML  SOLN  COMPARISON:  None.  FINDINGS: No active processes in the lower lungs. There is a calcified granuloma in the right lower lobe. No pleural or pericardial fluid.  The liver has a normal appearance without focal lesions or biliary ductal dilatation. No calcified gallstones. The spleen is normal. The body and tail of the pancreas are normal. However, the head of the pancreas shows mild regional edema and  swelling consistent with mild pancreatitis. The adrenal glands are normal. The kidneys are normal. The aorta shows mild atherosclerosis but no aneurysm. 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 No retroperitoneal mass or lymphadenopathy. The appendix is normal. No other bowel pathology. No fluid in the peritoneal space. Bladder, prostate gland and seminal vesicles are unremarkable.  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  Normal appearing appendix.   Electronically Signed   By: Nelson Chimes M.D.   On: 05/20/2014 19:56      MDM   Iv ns. Labs.  Wbc elevated. Will get ct.  Reviewed nursing notes and prior charts for additional history.   Ct discussed w pt/family.  Pain much improved.  No nv.  Afeb.   Pt appears stable for d/c.  On discussions w pt/fam, it sounds as though recurrent finding in imaging of mild pancreatitis/mild edema in head pancreas, without elevation lipase.  Prior gi workup at [facility] unremarkable per family.  Pt lives here, pcp is Dr Alain Marion.  Will have f/u closely and also refer to gi for eval re etiology of these recurrent issues.      Mirna Mires, MD 05/20/14 2035

## 2014-05-20 NOTE — Discharge Instructions (Signed)
It was our pleasure to provide your ER care today - we hope that you feel better.  Clear liquid diet for the next day, then advance as tolerated.    You may take percocet as need for pain - no driving when taking. Also do not take tylenol or acetaminophen containing medication when taking percocet.  You may take zofran as need for nausea.  Follow up with Dr Alain Marion in the next few days.  Also follow up with GI doctor in the next couple weeks for these recurrent symptoms and pancreatic issue.  Return to ER if worse, new symptoms, fevers, persistent vomiting, worsening or intractable pain, other concern.  You were given pain medication in the ER  - no driving for the next 4 hours.    Abdominal Pain Many things can cause abdominal pain. Usually, abdominal pain is not caused by a disease and will improve without treatment. It can often be observed and treated at home. Your health care provider will do a physical exam and possibly order blood tests and X-rays to help determine the seriousness of your pain. However, in many cases, more time must pass before a clear cause of the pain can be found. Before that point, your health care provider may not know if you need more testing or further treatment. HOME CARE INSTRUCTIONS  Monitor your abdominal pain for any changes. The following actions may help to alleviate any discomfort you are experiencing:  Only take over-the-counter or prescription medicines as directed by your health care provider.  Do not take laxatives unless directed to do so by your health care provider.  Try a clear liquid diet (broth, tea, or water) as directed by your health care provider. Slowly move to a bland diet as tolerated. SEEK MEDICAL CARE IF:  You have unexplained abdominal pain.  You have abdominal pain associated with nausea or diarrhea.  You have pain when you urinate or have a bowel movement.  You experience abdominal pain that wakes you in the  night.  You have abdominal pain that is worsened or improved by eating food.  You have abdominal pain that is worsened with eating fatty foods.  You have a fever. SEEK IMMEDIATE MEDICAL CARE IF:   Your pain does not go away within 2 hours.  You keep throwing up (vomiting).  Your pain is felt only in portions of the abdomen, such as the right side or the left lower portion of the abdomen.  You pass bloody or black tarry stools. MAKE SURE YOU:  Understand these instructions.   Will watch your condition.   Will get help right away if you are not doing well or get worse.  Document Released: 02/13/2005 Document Revised: 05/11/2013 Document Reviewed: 01/13/2013 [facility] Patient Information 2015 [employer], [place]. This information is not intended to replace advice given to you by your health care provider. Make sure you discuss any questions you have with your health care provider.        Acute Pancreatitis Acute pancreatitis is a disease in which the pancreas becomes suddenly inflamed. The pancreas is a large gland located behind your stomach. The pancreas produces enzymes that help digest food. The pancreas also releases the hormones glucagon and insulin that help regulate blood sugar. Damage to the pancreas occurs when the digestive enzymes from the pancreas are activated and begin attacking the pancreas before being released into the intestine. Most acute attacks last a couple of days and can cause serious complications. Some people become dehydrated and  develop low blood pressure. In severe cases, bleeding into the pancreas can lead to shock and can be life-threatening. The lungs, heart, and kidneys may fail. CAUSES  Pancreatitis can happen to anyone. In some cases, the cause is unknown. Most cases are caused by:  Alcohol abuse.  Gallstones. Other less common causes are:  Certain medicines.  Exposure to certain chemicals.  Infection.  Damage caused by an accident  (trauma).  Abdominal surgery. SYMPTOMS   Pain in the upper abdomen that may radiate to the back.  Tenderness and swelling of the abdomen.  Nausea and vomiting. DIAGNOSIS  Your caregiver will perform a physical exam. Blood and stool tests may be done to confirm the diagnosis. Imaging tests may also be done, such as X-rays, CT scans, or an ultrasound of the abdomen. TREATMENT  Treatment usually requires a stay in the hospital. Treatment may include:  Pain medicine.  Fluid replacement through an intravenous line (IV).  Placing a tube in the stomach to remove stomach contents and control vomiting.  Not eating for 3 or 4 days. This gives your pancreas a rest, because enzymes are not being produced that can cause further damage.  Antibiotic medicines if your condition is caused by an infection.  Surgery of the pancreas or gallbladder. HOME CARE INSTRUCTIONS   Follow the diet advised by your caregiver. This may involve avoiding alcohol and decreasing the amount of fat in your diet.  Eat smaller, more frequent meals. This reduces the amount of digestive juices the pancreas produces.  Drink enough fluids to keep your urine clear or pale yellow.  Only take over-the-counter or prescription medicines as directed by your caregiver.  Avoid drinking alcohol if it caused your condition.  Do not smoke.  Get plenty of rest.  Check your blood sugar at home as directed by your caregiver.  Keep all follow-up appointments as directed by your caregiver. SEEK MEDICAL CARE IF:   You do not recover as quickly as expected.  You develop new or worsening symptoms.  You have persistent pain, weakness, or nausea.  You recover and then have another episode of pain. SEEK IMMEDIATE MEDICAL CARE IF:   You are unable to eat or keep fluids down.  Your pain becomes severe.  You have a fever or persistent symptoms for more than 2 to 3 days.  You have a fever and your symptoms suddenly get  worse.  Your skin or the white part of your eyes turn yellow (jaundice).  You develop vomiting.  You feel dizzy, or you faint.  Your blood sugar is high (over 300 mg/dL). MAKE SURE YOU:   Understand these instructions.  Will watch your condition.  Will get help right away if you are not doing well or get worse. Document Released: 05/06/2005 Document Revised: 11/05/2011 Document Reviewed: 08/15/2011 [facility] Patient Information 2015 [place], [place]. This information is not intended to replace advice given to you by your health care provider. Make sure you discuss any questions you have with your health care provider.

## 2014-05-20 NOTE — ED Notes (Signed)
Pt c/o RLQ abd pain/NVD x 3 days.

## 2014-05-20 NOTE — ED Notes (Signed)
Bed: OH60 Expected date:  Expected time:  Means of arrival:  Comments: CA pt in lobby

## 2014-05-20 NOTE — ED Notes (Signed)
Patient medicated for pain, see MAR VS updated and stable Patient and pt's family deny further needs at this time Side rails up, call bell in reach

## 2014-05-26 ENCOUNTER — Other Ambulatory Visit: Payer: Self-pay | Admitting: Internal Medicine

## 2014-05-26 ENCOUNTER — Encounter: Payer: Self-pay | Admitting: Internal Medicine

## 2014-05-26 DIAGNOSIS — K8500 Idiopathic acute pancreatitis without necrosis or infection: Secondary | ICD-10-CM

## 2014-05-27 NOTE — Telephone Encounter (Signed)
11/15/13 OV note and 11/16/13 labs faxed to [phone].

## 2014-07-18 IMAGING — CT CT NECK W/ CM
5 of 6 series · 15 of 33 positions shown, 17 images · IV contrast (omnipaque)
Comparison: None.

CLINICAL DATA: Palpable left paraspinal posterior neck mass.

CT NECK WITH CONTRAST
TECHNIQUE: Multidetector CT imaging of the neck was performed with
intravenous contrast.
Contrast: 75mL OMNIPAQUE IOHEXOL 300 MG/ML  SOLN

[Series 3: neck_routine 3.0 b40s st · axial · 0.39mm/px · z∈[-202,-118]mm · 2 of 85 slices shown, 3 images (1 of 2)]
[im 29/85  soft-tissue]
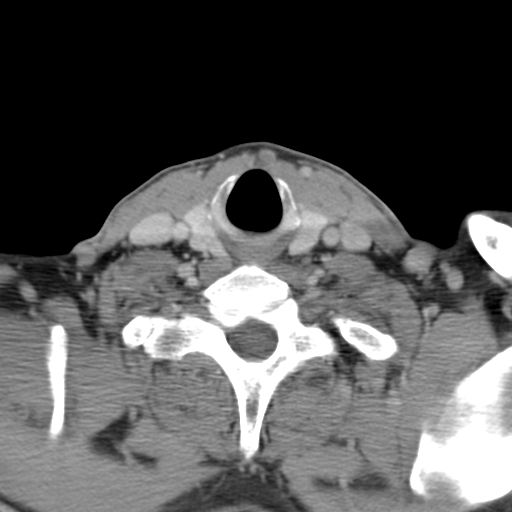
[im 29/85  bone]
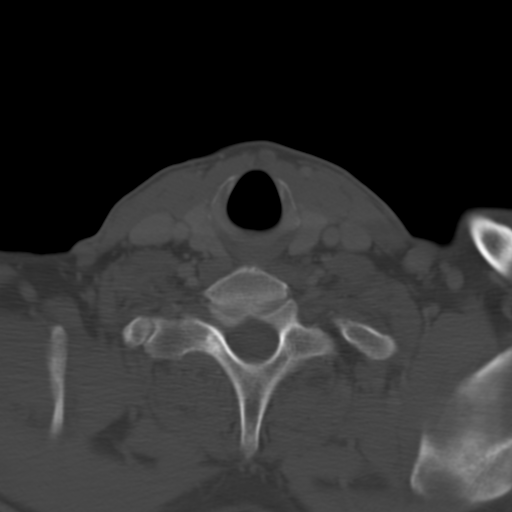
[im 57/85  bone]
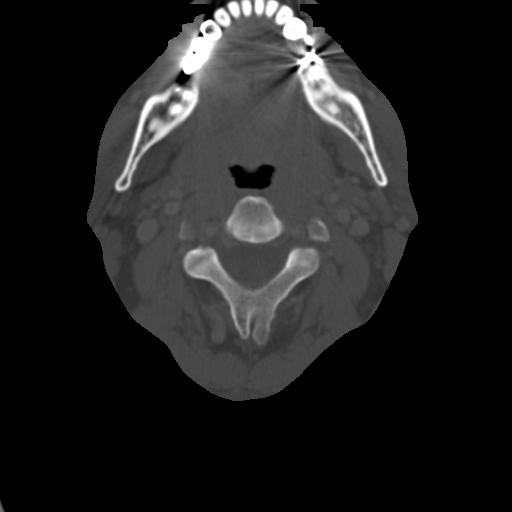

[Series 6: neck_routine 3.0 b40s st · axial · 0.79mm/px · z∈[-202,-118]mm · 2 of 85 slices shown (2 of 2)]
[im 29/85  bone]
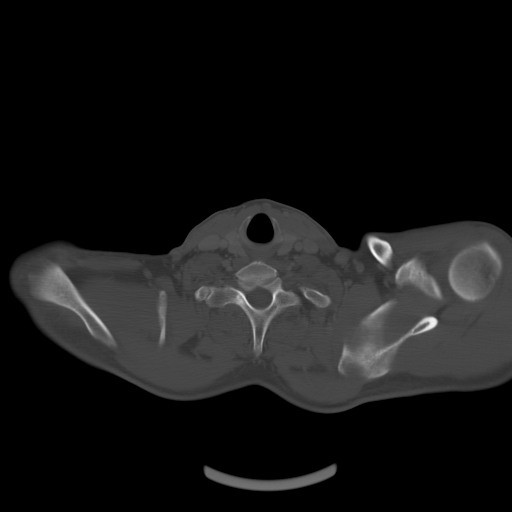
[im 57/85  bone]
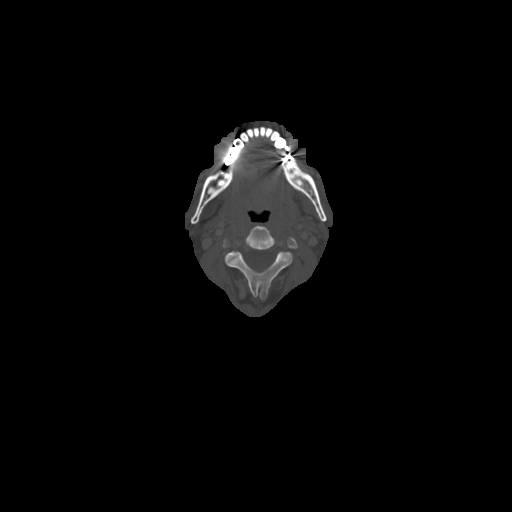

[Series 602: cor · coronal · 0.50mm/px · 3 of 95 slices shown]
[im 19/95  bone]
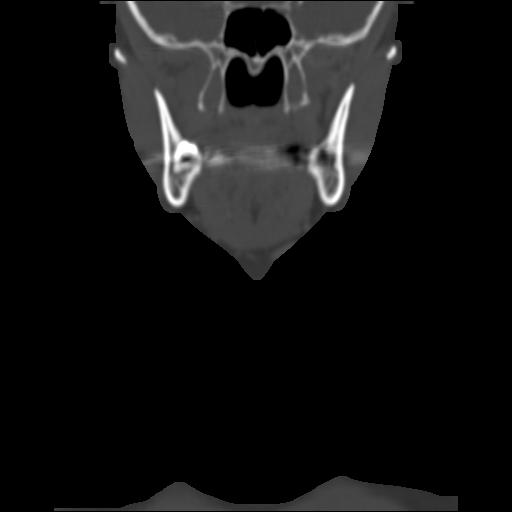
[im 38/95  bone]
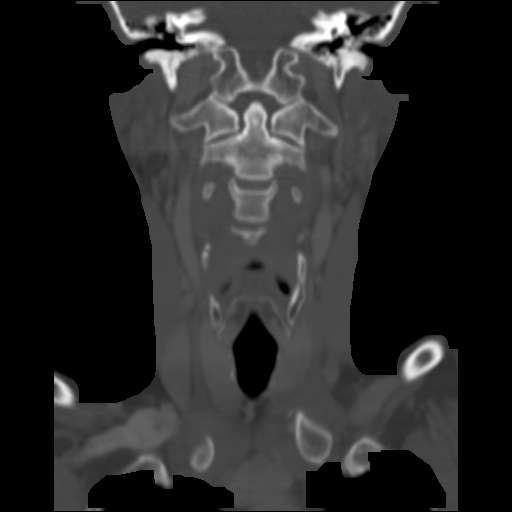
[im 57/95  bone]
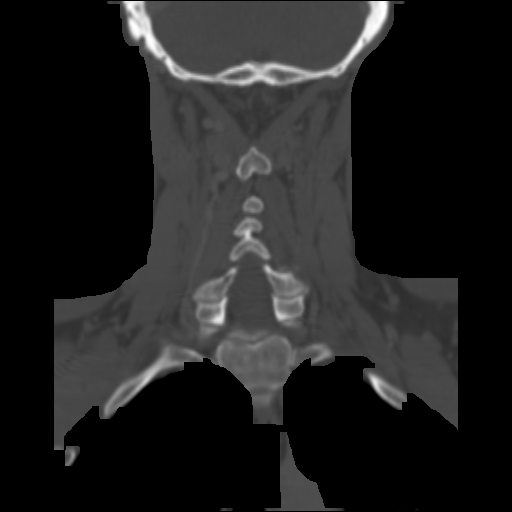

[Series 603: sag · sagittal · 0.50mm/px · 5 of 87 slices shown, 6 images]
[im 29/87  bone]
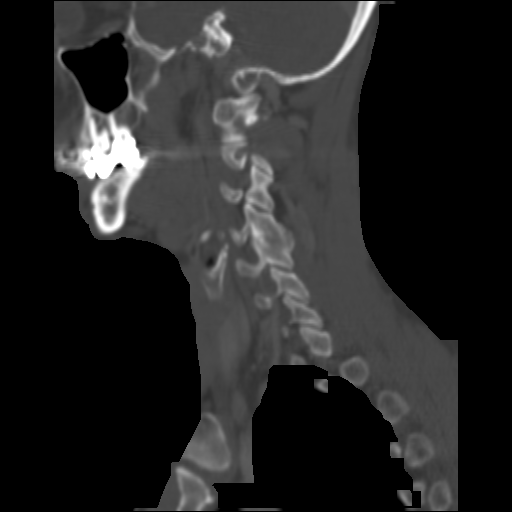
[im 36/87  bone]
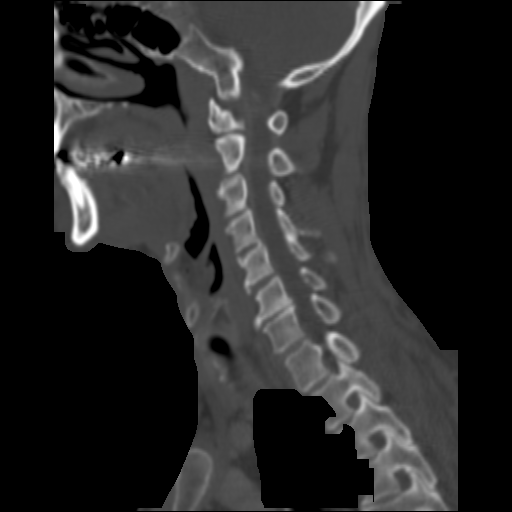
[im 44/87  soft-tissue]
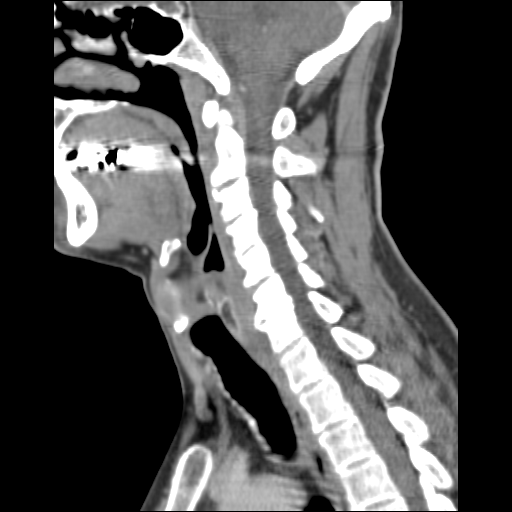
[im 44/87  bone]
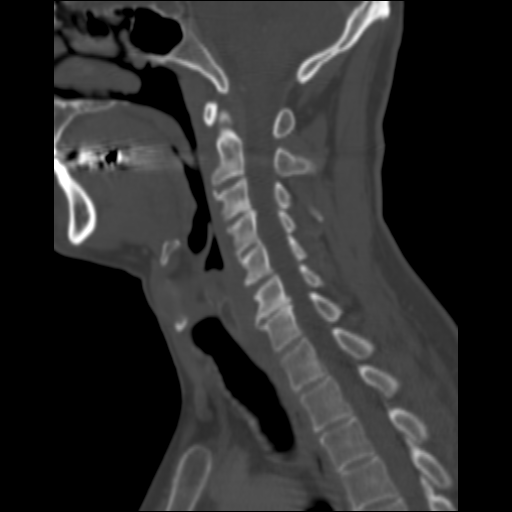
[im 51/87  bone]
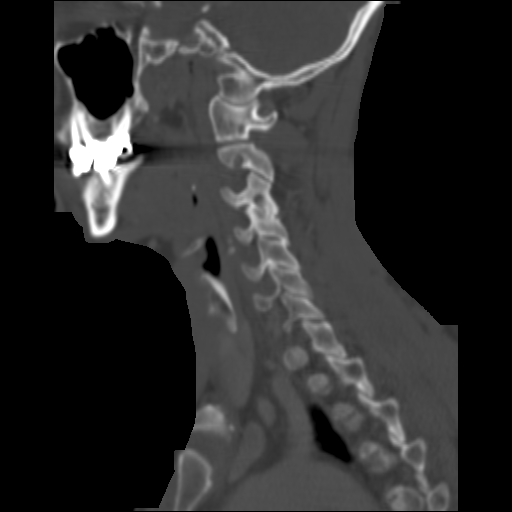
[im 58/87  bone]
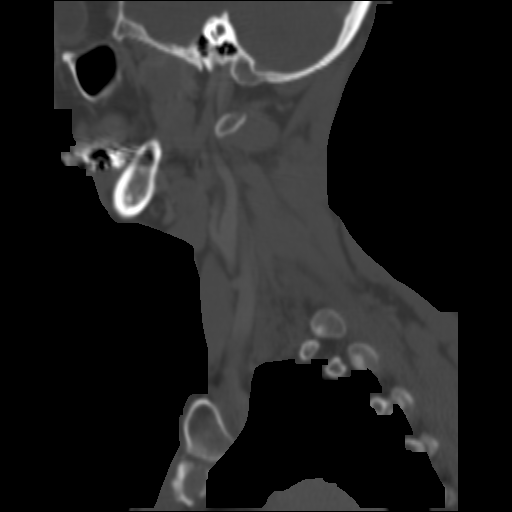

[Series 604: axial · axial · 0.50mm/px · z∈[-264,-155]mm · 3 of 117 slices shown]
[im 30/117  bone]
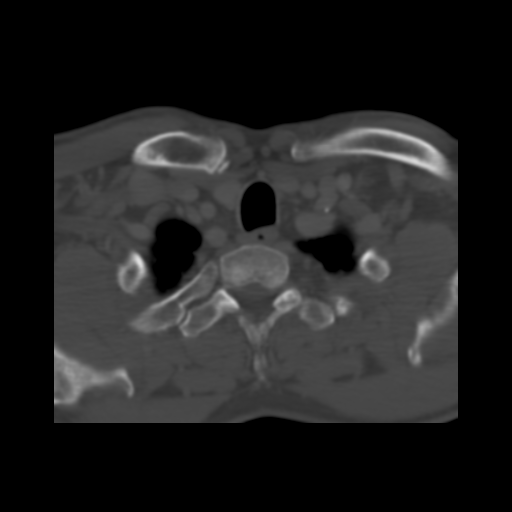
[im 59/117  bone]
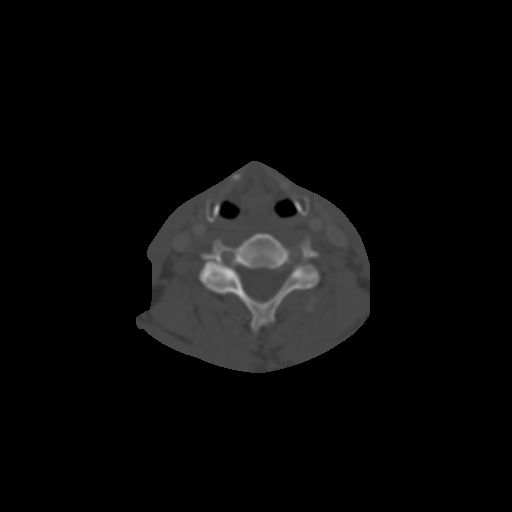
[im 88/117  bone]
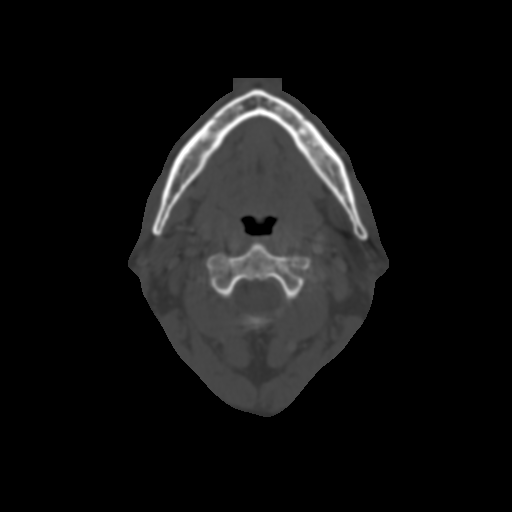

[15 of 33 positions shown; findings below may reference images not displayed]

FINDINGS: Minimal stranding over the posterior deep cervical fascia
could represent minimal fibrosis or inflammatory change.  No
discrete mass lesion is evident.  The muscles are symmetric.

Limited imaging of the brain is within normal limits.  No focal
mucosal or submucosal lesions are evident.  The thyroid is within
normal limits.  No significant adenopathy is evident.

Focal degenerative changes are present in the cervical spine at C6-
7.  No other focal bone lesions are evident.  The lung apices are
clear.
IMPRESSION: 1.  Minimal stranding over the posterior deep cervical fascia could
represent to minimal fibrosis or inflammatory change.
2.  No discrete mass.
3.  Focal endplate degenerative changes are noted at C6-7.

## 2014-07-27 ENCOUNTER — Encounter: Payer: Self-pay | Admitting: Internal Medicine

## 2014-07-28 ENCOUNTER — Encounter: Payer: Self-pay | Admitting: Internal Medicine

## 2015-02-10 ENCOUNTER — Ambulatory Visit (INDEPENDENT_AMBULATORY_CARE_PROVIDER_SITE_OTHER): Payer: Managed Care, Other (non HMO) | Admitting: Internal Medicine

## 2015-02-10 ENCOUNTER — Other Ambulatory Visit (INDEPENDENT_AMBULATORY_CARE_PROVIDER_SITE_OTHER): Payer: Managed Care, Other (non HMO)

## 2015-02-10 ENCOUNTER — Encounter: Payer: Managed Care, Other (non HMO) | Admitting: Internal Medicine

## 2015-02-10 ENCOUNTER — Encounter: Payer: Self-pay | Admitting: Internal Medicine

## 2015-02-10 VITALS — BP 130/88 | HR 60 | Ht 68.0 in | Wt 168.0 lb

## 2015-02-10 DIAGNOSIS — Q453 Other congenital malformations of pancreas and pancreatic duct: Secondary | ICD-10-CM

## 2015-02-10 DIAGNOSIS — Z0000 Encounter for general adult medical examination without abnormal findings: Secondary | ICD-10-CM

## 2015-02-10 DIAGNOSIS — Z23 Encounter for immunization: Secondary | ICD-10-CM | POA: Diagnosis not present

## 2015-02-10 LAB — LIPID PANEL
CHOL/HDL RATIO: 3
Cholesterol: 192 mg/dL (ref 0–200)
HDL: 61.4 mg/dL (ref >39.00)
LDL Cholesterol: 116 mg/dL — ABNORMAL HIGH (ref 0–99)
NonHDL: 130.17
TRIGLYCERIDES: 69 mg/dL (ref 0.0–149.0)
VLDL: 13.8 mg/dL (ref 0.0–40.0)

## 2015-02-10 LAB — CBC WITH DIFFERENTIAL/PLATELET
BASOS ABS: 0 10*3/uL (ref 0.0–0.1)
Basophils Relative: 0.3 % (ref 0.0–3.0)
EOS ABS: 0.1 10*3/uL (ref 0.0–0.7)
Eosinophils Relative: 1.2 % (ref 0.0–5.0)
HCT: 44.5 % (ref 39.0–52.0)
Hemoglobin: 15.2 g/dL (ref 13.0–17.0)
LYMPHS PCT: 28.9 % (ref 12.0–46.0)
Lymphs Abs: 1.7 10*3/uL (ref 0.7–4.0)
MCHC: 34 g/dL (ref 30.0–36.0)
MCV: 90.7 fl (ref 78.0–100.0)
Monocytes Absolute: 0.8 10*3/uL (ref 0.1–1.0)
Monocytes Relative: 12.6 % — ABNORMAL HIGH (ref 3.0–12.0)
NEUTROS ABS: 3.4 10*3/uL (ref 1.4–7.7)
Neutrophils Relative %: 57 % (ref 43.0–77.0)
Platelets: 264 10*3/uL (ref 150.0–400.0)
RBC: 4.91 Mil/uL (ref 4.22–5.81)
RDW: 12.7 % (ref 11.5–15.5)
WBC: 6 10*3/uL (ref 4.0–10.5)

## 2015-02-10 LAB — URINALYSIS
Bilirubin Urine: NEGATIVE
Hgb urine dipstick: NEGATIVE
Ketones, ur: NEGATIVE
LEUKOCYTES UA: NEGATIVE
NITRITE: NEGATIVE
Specific Gravity, Urine: 1.02 (ref 1.000–1.030)
Total Protein, Urine: NEGATIVE
Urine Glucose: NEGATIVE
Urobilinogen, UA: 0.2 (ref 0.0–1.0)
pH: 7 (ref 5.0–8.0)

## 2015-02-10 LAB — HEPATIC FUNCTION PANEL
ALBUMIN: 4.4 g/dL (ref 3.5–5.2)
ALK PHOS: 101 U/L (ref 39–117)
ALT: 20 U/L (ref 0–53)
AST: 26 U/L (ref 0–37)
Bilirubin, Direct: 0.1 mg/dL (ref 0.0–0.3)
TOTAL PROTEIN: 7.9 g/dL (ref 6.0–8.3)
Total Bilirubin: 0.5 mg/dL (ref 0.2–1.2)

## 2015-02-10 LAB — BASIC METABOLIC PANEL
BUN: 21 mg/dL (ref 6–23)
CALCIUM: 9.6 mg/dL (ref 8.4–10.5)
CO2: 30 meq/L (ref 19–32)
CREATININE: 0.92 mg/dL (ref 0.40–1.50)
Chloride: 102 mEq/L (ref 96–112)
GFR: 90.24 mL/min (ref >60.00)
Glucose, Bld: 110 mg/dL — ABNORMAL HIGH (ref 70–99)
Potassium: 4.5 mEq/L (ref 3.5–5.1)
Sodium: 139 mEq/L (ref 135–145)

## 2015-02-10 LAB — TSH: TSH: 1.16 u[IU]/mL (ref 0.35–4.50)

## 2015-02-10 LAB — PSA: PSA: 0.73 ng/mL (ref 0.10–4.00)

## 2015-02-10 MED ORDER — VITAMIN D3 50 MCG (2000 UT) PO CAPS: 2000.0000 [IU] | ORAL_CAPSULE | Freq: Every day | ORAL | Status: DC

## 2015-02-10 NOTE — Progress Notes (Signed)
Subjective:  Patient ID: Jeff Le, male    DOB: 1961-02-08  Age: 54 y.o. MRN: [id number]  CC: Annual Exam   HPI [name] presents for well exam  Outpatient Prescriptions Prior to Visit  Medication Sig Dispense Refill  . hyoscyamine (LEVSIN) 0.125 MG tablet Take 0.125 mg by mouth every 4 (four) hours as needed (abdominal pain).    . ondansetron (ZOFRAN ODT) 8 MG disintegrating tablet Take 1 tablet (8 mg total) by mouth every 8 (eight) hours as needed for nausea or vomiting. (Patient not taking: Reported on 02/10/2015) 10 tablet 0  . ondansetron (ZOFRAN) 8 MG tablet Take 8 mg by mouth every 8 (eight) hours as needed for nausea or vomiting (vomiting).    Marland Kitchen oxyCODONE-acetaminophen (PERCOCET/ROXICET) 5-325 MG per tablet Take 1-2 tablets by mouth every 6 (six) hours as needed for severe pain. (Patient not taking: Reported on 02/10/2015) 20 tablet 0   No facility-administered medications prior to visit.    ROS Review of Systems  Constitutional: Negative for appetite change, fatigue and unexpected weight change.  HENT: Negative for congestion, nosebleeds, sneezing, sore throat and trouble swallowing.   Eyes: Negative for itching and visual disturbance.  Respiratory: Negative for cough.   Cardiovascular: Negative for chest pain, palpitations and leg swelling.  Gastrointestinal: Negative for nausea, diarrhea, blood in stool and abdominal distention.  Genitourinary: Negative for frequency and hematuria.  Musculoskeletal: Negative for back pain, joint swelling, gait problem and neck pain.  Skin: Negative for rash.  Neurological: Negative for dizziness, tremors, speech difficulty and weakness.  Psychiatric/Behavioral: Negative for suicidal ideas, sleep disturbance, dysphoric mood and agitation. The patient is not nervous/anxious.     Objective:  BP 130/88 mmHg  Pulse 60  Ht 5\' 8"  (1.727 m)  Wt 168 lb (76.204 kg)  BMI 25.55 kg/m2  SpO2 97%  BP  Readings from Last 3 Encounters:  02/10/15 130/88  05/20/14 132/83  11/15/13 114/78    Wt Readings from Last 3 Encounters:  02/10/15 168 lb (76.204 kg)  11/15/13 167 lb (75.751 kg)  06/11/13 167 lb (75.751 kg)    Physical Exam  Constitutional: He is oriented to person, place, and time. He appears well-developed and well-nourished. No distress.  HENT:  Head: Normocephalic and atraumatic.  Right Ear: External ear normal.  Left Ear: External ear normal.  Nose: Nose normal.  Mouth/Throat: Oropharynx is clear and moist. No oropharyngeal exudate.  Eyes: Conjunctivae and EOM are normal. Pupils are equal, round, and reactive to light. Right eye exhibits no discharge. Left eye exhibits no discharge. No scleral icterus.  Neck: Normal range of motion. Neck supple. No JVD present. No tracheal deviation present. No thyromegaly present.  Cardiovascular: Normal rate, regular rhythm, normal heart sounds and intact distal pulses.  Exam reveals no gallop and no friction rub.   No murmur heard. Pulmonary/Chest: Effort normal and breath sounds normal. No stridor. No respiratory distress. He has no wheezes. He has no rales. He exhibits no tenderness.  Abdominal: Soft. Bowel sounds are normal. He exhibits no distension and no mass. There is no tenderness. There is no rebound and no guarding.  Genitourinary: Rectum normal, prostate normal and penis normal. Guaiac negative stool. No penile tenderness.  Musculoskeletal: Normal range of motion. He exhibits no edema or tenderness.  Lymphadenopathy:    He has no cervical adenopathy.  Neurological: He is alert and oriented to person, place, and time. He has normal reflexes. No cranial nerve deficit. He exhibits normal muscle tone.  Coordination normal.  Skin: Skin is warm and dry. No rash noted. He is not diaphoretic. No erythema. No pallor.  Psychiatric: He has a normal mood and affect. His behavior is normal. Judgment and thought content normal.    Lab  Results  Component Value Date   WBC 6.0 02/10/2015   HGB 15.2 02/10/2015   HCT 44.5 02/10/2015   PLT 264.0 02/10/2015   GLUCOSE 110* 02/10/2015   CHOL 192 02/10/2015   TRIG 69.0 02/10/2015   HDL 61.40 02/10/2015   LDLDIRECT 136.4 03/23/2012   LDLCALC 116* 02/10/2015   ALT 20 02/10/2015   AST 26 02/10/2015   NA 139 02/10/2015   K 4.5 02/10/2015   CL 102 02/10/2015   CREATININE 0.92 02/10/2015   BUN 21 02/10/2015   CO2 30 02/10/2015   TSH 1.16 02/10/2015   PSA 0.73 02/10/2015    Ct Abdomen Pelvis W Contrast  05/20/2014   CLINICAL DATA:  Right lower quadrant pain, nausea, vomiting and diarrhea. Three days duration.  EXAM: CT ABDOMEN AND PELVIS WITH CONTRAST  TECHNIQUE: Multidetector CT imaging of the abdomen and pelvis was performed using the standard protocol following bolus administration of intravenous contrast.  CONTRAST:  192mL OMNIPAQUE IOHEXOL 300 MG/ML  SOLN  COMPARISON:  None.  FINDINGS: No active processes in the lower lungs. There is a calcified granuloma in the right lower lobe. No pleural or pericardial fluid.  The liver has a normal appearance without focal lesions or biliary ductal dilatation. No calcified gallstones. The spleen is normal. The body and tail of the pancreas are normal. However, the head of the pancreas shows mild regional edema and swelling consistent with mild pancreatitis. The adrenal glands are normal. The kidneys are normal. The aorta shows mild atherosclerosis but no aneurysm. 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 No retroperitoneal mass or lymphadenopathy. The appendix is normal. No other bowel pathology. No fluid in the peritoneal space. Bladder, prostate gland and seminal vesicles are unremarkable.  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  Normal appearing appendix.   Electronically Signed   By: Nelson Chimes M.D.   On: 05/20/2014 19:56   EKG S brady  Assessment & Plan:   Afton was seen today for annual exam.  Diagnoses and  all orders for this visit:  Well adult exam -     Hepatic function panel; Future -     Basic metabolic panel; Future -     CBC with Differential/Platelet; Future -     Lipid panel; Future -     PSA; Future -     TSH; Future -     Urinalysis; Future -     EKG 12-Lead  Need for influenza vaccination -     Flu Vaccine QUAD 36+ mos IM  Pancreatic abnormality  Other orders -     Cholecalciferol (VITAMIN D3) 2000 UNITS capsule; Take 1 capsule (2,000 Units total) by mouth daily.   I have discontinued Mr. Battisti's ondansetron, hyoscyamine, ondansetron, and oxyCODONE-acetaminophen. I am also having him start on Vitamin D3.  Meds ordered this encounter  Medications  . Cholecalciferol (VITAMIN D3) 2000 UNITS capsule    Sig: Take 1 capsule (2,000 Units total) by mouth daily.    Dispense:  100 capsule    Refill:  3     Follow-up: Return in about 1 year (around 02/10/2016) for Wellness Exam.  Walker Kehr, MD

## 2015-02-10 NOTE — Assessment & Plan Note (Signed)
We discussed age appropriate health related issues, including available/recomended screening tests and vaccinations. We discussed a need for adhering to healthy diet and exercise. Labs/EKG were reviewed/ordered. All questions were answered.   

## 2015-02-10 NOTE — Progress Notes (Signed)
Pre visit review using our clinic review tool, if applicable. No additional management support is needed unless otherwise documented below in the visit note. 

## 2015-02-10 NOTE — Assessment & Plan Note (Signed)
Endo [place] at [facility] - ok No ETOH No sx

## 2015-03-20 DIAGNOSIS — R935 Abnormal findings on diagnostic imaging of other abdominal regions, including retroperitoneum: Secondary | ICD-10-CM | POA: Insufficient documentation

## 2015-06-23 ENCOUNTER — Encounter: Payer: Self-pay | Admitting: Internal Medicine

## 2015-12-14 ENCOUNTER — Other Ambulatory Visit (INDEPENDENT_AMBULATORY_CARE_PROVIDER_SITE_OTHER): Payer: Managed Care, Other (non HMO)

## 2015-12-14 ENCOUNTER — Encounter: Payer: Self-pay | Admitting: Family

## 2015-12-14 ENCOUNTER — Ambulatory Visit: Payer: Managed Care, Other (non HMO) | Admitting: Family

## 2015-12-14 ENCOUNTER — Ambulatory Visit (INDEPENDENT_AMBULATORY_CARE_PROVIDER_SITE_OTHER)
Admission: RE | Admit: 2015-12-14 | Discharge: 2015-12-14 | Disposition: A | Discharge status: Home / Self Care | Payer: Managed Care, Other (non HMO) | Source: Ambulatory Visit | Attending: Family | Admitting: Family

## 2015-12-14 ENCOUNTER — Ambulatory Visit (INDEPENDENT_AMBULATORY_CARE_PROVIDER_SITE_OTHER): Payer: Managed Care, Other (non HMO) | Admitting: Family

## 2015-12-14 VITALS — BP 120/80 | HR 76 | Temp 98.2°F | Resp 16 | Ht 68.0 in | Wt 167.0 lb

## 2015-12-14 DIAGNOSIS — J029 Acute pharyngitis, unspecified: Secondary | ICD-10-CM

## 2015-12-14 DIAGNOSIS — R918 Other nonspecific abnormal finding of lung field: Secondary | ICD-10-CM

## 2015-12-14 LAB — CBC WITH DIFFERENTIAL/PLATELET
Basophils Relative: 0.4 % (ref 0.0–3.0)
EOS PCT: 0.4 % (ref 0.0–5.0)
HEMATOCRIT: 42.5 % (ref 39.0–52.0)
HEMOGLOBIN: 14.4 g/dL (ref 13.0–17.0)
Lymphocytes Relative: 24.7 % (ref 12.0–46.0)
MCHC: 34 g/dL (ref 30.0–36.0)
MCV: 88.9 fl (ref 78.0–100.0)
Monocytes Relative: 21.9 % — ABNORMAL HIGH (ref 3.0–12.0)
Neutrophils Relative %: 52.6 % (ref 43.0–77.0)
Platelets: 209 10*3/uL (ref 150.0–400.0)
RBC: 4.78 Mil/uL (ref 4.22–5.81)
RDW: 12.8 % (ref 11.5–15.5)
WBC: 4.9 10*3/uL (ref 4.0–10.5)

## 2015-12-14 MED ORDER — AZITHROMYCIN 250 MG PO TABS: ORAL_TABLET | ORAL | 0 refills | Status: DC

## 2015-12-14 NOTE — Progress Notes (Addendum)
Subjective:    Patient ID: Jeff Le, male    DOB: 1961/01/04, 55 y.o.   MRN: [id number]  Chief Complaint  Patient presents with  . Sore Throat    x2 weeks, been sick, sore throat     HPI:  Jeff Le is a 55 y.o. male who  has a past medical history of Allergy; Dyslipidemia; and Snoring. and presents today for an acute office visit.  This is a new problem. Associated symptoms of sore throat has been going on for sore throat that has been going on for about 2 weeks. Does report fever of about 100 with the last fever about 2 weeks. Indicates it started with a fever and then went to his throat. Modifying factors include OTC medications which have helped a little. He has also taken Theraflu. Denies any recent antiibitoics. Presents with results of MRI performed to evaluate pancreas that shows two small lung nodules with recommendation to continue to monitor. Denies any weight loss, chills, or night sweats.  No Known Allergies   Current Outpatient Prescriptions on File Prior to Visit  Medication Sig Dispense Refill  . Cholecalciferol (VITAMIN D3) 2000 UNITS capsule Take 1 capsule (2,000 Units total) by mouth daily. 100 capsule 3   No current facility-administered medications on file prior to visit.     Past Medical History:  Diagnosis Date  . Allergy    rhinitis  . Dyslipidemia    very mild  . Snoring      Review of Systems  Constitutional: Positive for fever. Negative for chills, diaphoresis and unexpected weight change.  HENT: Positive for sore throat. Negative for congestion, ear pain, postnasal drip and sinus pressure.   Respiratory: Negative for cough, chest tightness, shortness of breath and wheezing.   Cardiovascular: Negative for chest pain, palpitations and leg swelling.      Objective:    BP 120/80 (BP Location: Left Arm, Patient Position: Sitting, Cuff Size: Normal)   Pulse 76   Temp 98.2 F (36.8 C) (Oral)   Resp 16    Ht 5\' 8"  (1.727 m)   Wt 167 lb (75.8 kg)   SpO2 97%   BMI 25.39 kg/m  Nursing note and vital signs reviewed.  Physical Exam  Constitutional: He is oriented to person, place, and time. He appears well-developed and well-nourished. No distress.  HENT:  Right Ear: Hearing, tympanic membrane, external ear and ear canal normal.  Left Ear: Hearing, tympanic membrane, external ear and ear canal normal.  Nose: Nose normal.  Mouth/Throat: Uvula is midline, oropharynx is clear and moist and mucous membranes are normal.  Cardiovascular: Normal rate, regular rhythm, normal heart sounds and intact distal pulses.   Pulmonary/Chest: Effort normal and breath sounds normal. No respiratory distress. He has no wheezes. He has no rales. He exhibits no tenderness.  Neurological: He is alert and oriented to person, place, and time.  Skin: Skin is warm and dry.  Psychiatric: He has a normal mood and affect. His behavior is normal. Judgment and thought content normal.       Assessment & Plan:   Problem List Items Addressed This Visit      Other   Lung nodule, multiple    Two lung nodules located on most recent MRI which patient indicates is chronic. Obtain x-ray to check current status. Patient requests follow up CT scan as he will be unavailable during the six month recommended time frame for follow up. Does not appear to have significant risk  factors for bronchogenic carcinoma and has no symptoms. Unlikely current symptoms are related to lung nodules. Obtain CT scan. Follow up pending imaging results.       Relevant Orders   CT CHEST WO CONTRAST   DG Chest 2 View (Completed)   Sore throat - Primary    Sore throat and fever with no specific origin. Obtain CBC. Does not appear to be strep throat. Start azithromycin. Does have lung nodules, however most likely benign and unlikely related. Continue over-the-counter medications as needed for symptom relief and supportive care. Follow-up if symptoms worsen or  do not improve.      Relevant Medications   azithromycin (ZITHROMAX) 250 MG tablet   Other Relevant Orders   CBC w/Diff (Completed)    Other Visit Diagnoses   None.      I am having Jeff Le start on azithromycin. I am also having him maintain his Vitamin D3.   Meds ordered this encounter  Medications  . azithromycin (ZITHROMAX) 250 MG tablet    Sig: Take 2 tablets by mouth for 1 day and then 1 tablet by mouth daily for 4 days    Dispense:  6 tablet    Refill:  0    Order Specific Question:   Supervising Provider    Answer:   Pricilla Holm A [id number]     Follow-up: Return if symptoms worsen or fail to improve.  Mauricio Po, FNP

## 2015-12-14 NOTE — Assessment & Plan Note (Signed)
Sore throat and fever with no specific origin. Obtain CBC. Does not appear to be strep throat. Start azithromycin. Does have lung nodules, however most likely benign and unlikely related. Continue over-the-counter medications as needed for symptom relief and supportive care. Follow-up if symptoms worsen or do not improve.

## 2015-12-14 NOTE — Patient Instructions (Addendum)
Thank you for choosing [facility].  Summary/Instructions:  They will call to schedule your CT scan  Please start the antibiotic.   Your prescription(s) have been submitted to your pharmacy or been printed and provided for you. Please take as directed and contact our office if you believe you are having problem(s) with the medication(s) or have any questions.  Please stop by the lab on the lower level of the building for your blood work. Your results will be released to [place] (or called to you) after review, usually within 72 hours after test completion. If any changes need to be made, you will be notified at that same time.  1. The lab is open from 7:30am to 5:30 pm Monday-Friday  2. No appointment is necessary  3. Fasting (if needed) is 6-8 hours after food and drink; black coffee and water  are okay    If your symptoms worsen or fail to improve, please contact our office for further instruction, or in case of emergency go directly to the emergency room at the closest medical facility.

## 2015-12-14 NOTE — Assessment & Plan Note (Signed)
Two lung nodules located on most recent MRI which patient indicates is chronic. Obtain x-ray to check current status. Patient requests follow up CT scan as he will be unavailable during the six month recommended time frame for follow up. Does not appear to have significant risk factors for bronchogenic carcinoma and has no symptoms. Unlikely current symptoms are related to lung nodules. Obtain CT scan. Follow up pending imaging results.

## 2015-12-15 ENCOUNTER — Other Ambulatory Visit (INDEPENDENT_AMBULATORY_CARE_PROVIDER_SITE_OTHER): Payer: Managed Care, Other (non HMO)

## 2015-12-15 ENCOUNTER — Telehealth: Payer: Self-pay | Admitting: Family

## 2015-12-15 ENCOUNTER — Encounter: Payer: Self-pay | Admitting: Family

## 2015-12-15 DIAGNOSIS — R748 Abnormal levels of other serum enzymes: Secondary | ICD-10-CM

## 2015-12-15 DIAGNOSIS — D72828 Other elevated white blood cell count: Secondary | ICD-10-CM

## 2015-12-15 LAB — SEDIMENTATION RATE: SED RATE: 27 mm/h — AB (ref 0–20)

## 2015-12-15 LAB — C-REACTIVE PROTEIN: CRP: 0.2 mg/dL — ABNORMAL LOW (ref 0.5–20.0)

## 2015-12-15 NOTE — Telephone Encounter (Signed)
Lab orders placed.  

## 2015-12-15 NOTE — Telephone Encounter (Signed)
Notified patient.

## 2015-12-15 NOTE — Telephone Encounter (Signed)
Patient states he seen Marya Amsler and had labs done.  States labs came back normal.  States he is not feeling any better and he consulted with his son who is MD and suggested patient have a ESR and a CRP lab drawn.  Patient is requesting to go to lab to have this done.  Please follow up in regard.

## 2015-12-15 NOTE — Telephone Encounter (Signed)
Patient is going out of town and would like to have labs done today.

## 2016-01-30 ENCOUNTER — Encounter: Payer: Self-pay | Admitting: Family

## 2016-02-01 ENCOUNTER — Encounter: Payer: Self-pay | Admitting: Internal Medicine

## 2016-02-05 ENCOUNTER — Encounter: Payer: Self-pay | Admitting: Family

## 2016-02-05 NOTE — Telephone Encounter (Signed)
Pt called to check up on this, he is very concern about he CT that he did last week. Please give him a call if Dr. Camila Li can. [phone]

## 2016-02-08 ENCOUNTER — Telehealth: Payer: Self-pay | Admitting: Internal Medicine

## 2016-02-08 ENCOUNTER — Encounter: Payer: Self-pay | Admitting: Family

## 2016-02-08 NOTE — Telephone Encounter (Signed)
Spoke w/pt Informed of CT results. He moved to [place]. CT mailed He knows to sch an appt w/a new PCP, cardio-thor surgery and w/pulmonology.

## 2016-04-05 ENCOUNTER — Telehealth: Payer: Self-pay

## 2016-04-05 ENCOUNTER — Encounter: Payer: Self-pay | Admitting: Nurse Practitioner

## 2016-04-05 ENCOUNTER — Telehealth: Payer: Self-pay | Admitting: Internal Medicine

## 2016-04-05 ENCOUNTER — Ambulatory Visit (INDEPENDENT_AMBULATORY_CARE_PROVIDER_SITE_OTHER): Payer: Managed Care, Other (non HMO) | Admitting: Nurse Practitioner

## 2016-04-05 ENCOUNTER — Encounter: Payer: Self-pay | Admitting: *Deleted

## 2016-04-05 VITALS — BP 148/86 | HR 65 | Temp 98.2°F | Ht 68.0 in | Wt 164.0 lb

## 2016-04-05 DIAGNOSIS — R079 Chest pain, unspecified: Secondary | ICD-10-CM

## 2016-04-05 MED ORDER — ASPIRIN EC 81 MG PO TBEC: 81.0000 mg | DELAYED_RELEASE_TABLET | Freq: Every day | ORAL | 2 refills | Status: AC

## 2016-04-05 MED ORDER — NITROGLYCERIN 0.4 MG SL SUBL: 0.4000 mg | SUBLINGUAL_TABLET | SUBLINGUAL | 0 refills | Status: AC | PRN

## 2016-04-05 NOTE — Telephone Encounter (Signed)
sch for 3 pm

## 2016-04-05 NOTE — Telephone Encounter (Signed)
Received call from [facility] and stated that pt has had chest pain for 3 days and wanted to to try to be worked in today. Refused the hospital. A note should be sent to you guys from them. sh

## 2016-04-05 NOTE — Progress Notes (Signed)
Subjective:  Patient ID: Jeff Le, male    DOB: 05-03-1961  Age: 55 y.o. MRN: [id number]  CC: Chest Pain (pt stated have discomfort on the chest for about 1 week.)   Chest Pain   This is a new problem. The current episode started in the past 7 days. The onset quality is gradual. The problem occurs intermittently. The problem has been waxing and waning. The pain is present in the lateral region. The pain is mild. The quality of the pain is described as dull. The pain does not radiate. Pertinent negatives include no abdominal pain, back pain, cough, diaphoresis, dizziness, exertional chest pressure, fever, headaches, irregular heartbeat, lower extremity edema, malaise/fatigue, nausea, orthopnea, palpitations, PND or weakness. He has tried nothing for the symptoms. Risk factors include male gender.  Pertinent negatives for family medical history include: no aortic dissection, no CAD, no diabetes, no heart disease, no hyperlipidemia, no hypertension, no early MI, no PE, no stroke, no sudden death and no TIA.    Outpatient Medications Prior to Visit  Medication Sig Dispense Refill  . azithromycin (ZITHROMAX) 250 MG tablet Take 2 tablets by mouth for 1 day and then 1 tablet by mouth daily for 4 days 6 tablet 0  . Cholecalciferol (VITAMIN D3) 2000 UNITS capsule Take 1 capsule (2,000 Units total) by mouth daily. 100 capsule 3   No facility-administered medications prior to visit.     ROS See HPI  Objective:  BP (!) 148/86 (BP Location: Left Arm, Patient Position: Sitting, Cuff Size: Normal)   Pulse 65   Temp 98.2 F (36.8 C)   Ht 5\' 8"  (1.727 m)   Wt 164 lb (74.4 kg)   SpO2 98%   BMI 24.94 kg/m   BP Readings from Last 3 Encounters:  04/05/16 (!) 148/86  12/14/15 120/80  02/10/15 130/88    Wt Readings from Last 3 Encounters:  04/05/16 164 lb (74.4 kg)  12/14/15 167 lb (75.8 kg)  02/10/15 168 lb (76.2 kg)    Physical Exam  Constitutional: He is oriented to  person, place, and time. No distress.  Neck: Normal range of motion. Neck supple.  Cardiovascular: Normal rate, regular rhythm and normal heart sounds.   Pulmonary/Chest: Effort normal and breath sounds normal.  Abdominal: Soft. Bowel sounds are normal.  Musculoskeletal: Normal range of motion. He exhibits no edema.  Neurological: He is alert and oriented to person, place, and time.  Skin: Skin is warm and dry.  Vitals reviewed.   Lab Results  Component Value Date   WBC 4.9 12/14/2015   HGB 14.4 12/14/2015   HCT 42.5 12/14/2015   PLT 209.0 12/14/2015   GLUCOSE 110 (H) 02/10/2015   CHOL 192 02/10/2015   TRIG 69.0 02/10/2015   HDL 61.40 02/10/2015   LDLDIRECT 136.4 03/23/2012   LDLCALC 116 (H) 02/10/2015   ALT 20 02/10/2015   AST 26 02/10/2015   NA 139 02/10/2015   K 4.5 02/10/2015   CL 102 02/10/2015   CREATININE 0.92 02/10/2015   BUN 21 02/10/2015   CO2 30 02/10/2015   TSH 1.16 02/10/2015   PSA 0.73 02/10/2015    Dg Chest 2 View  Result Date: 12/14/2015 CLINICAL DATA:  Lung nodule with intermittent fever. EXAM: CHEST  2 VIEW COMPARISON:  None. FINDINGS: The lungs are clear wiithout focal pneumonia, edema, pneumothorax or pleural effusion. Previous CT scan demonstrated a calcified nodule in the posterior right upper lobe though is incompletely visualized. On today's study, radiodense nodule projects  over the T9 vertebral body which would be compatible with the granuloma seen on previous CT. Lungs are hyperexpanded. The cardiopericardial silhouette is within normal limits for size. The visualized bony structures of the thorax are intact. IMPRESSION: Nodular density projecting over the T9 vertebral body on the lateral film cannot be localized on the frontal projection, likely obscured by vasculature in the hilum. This is compatible with a calcified granuloma seen on the previous abdomen and pelvis CT. Hyperinflation without acute cardiopulmonary process. Electronically Signed   By:  Misty Stanley M.D.   On: 12/14/2015 15:21   Assessment & Plan:   Les was seen today for chest pain.  Diagnoses and all orders for this visit:  Chest pain, unspecified type -     EKG 12-Lead -     aspirin EC 81 MG tablet; Take 1 tablet (81 mg total) by mouth daily. -     nitroGLYCERIN (NITROSTAT) 0.4 MG SL tablet; Place 1 tablet (0.4 mg total) under the tongue every 5 (five) minutes as needed for chest pain. -     Ambulatory referral to Cardiology   I have discontinued Mr. Malstrom's Vitamin D3 and azithromycin. I am also having him start on aspirin EC and nitroGLYCERIN.  Meds ordered this encounter  Medications  . aspirin EC 81 MG tablet    Sig: Take 1 tablet (81 mg total) by mouth daily.    Dispense:  30 tablet    Refill:  2    Order Specific Question:   Supervising Provider    Answer:   Cassandria Anger [[id number]]  . nitroGLYCERIN (NITROSTAT) 0.4 MG SL tablet    Sig: Place 1 tablet (0.4 mg total) under the tongue every 5 (five) minutes as needed for chest pain.    Dispense:  30 tablet    Refill:  0    Order Specific Question:   Supervising Provider    Answer:   Cassandria Anger [[id number]]    Follow-up: Return if symptoms worsen or fail to improve.  Wilfred Lacy, NP

## 2016-04-05 NOTE — Telephone Encounter (Signed)
Patient Name: Jeff Le DOB: 28-Aug-1960 Initial Comment Caller states he has pain on his left side, around his heart.. Nurse Assessment Nurse: Andria Frames, RN, Aeriel Date/Time (Eastern Time): 04/05/2016 8:23:00 AM Confirm and document reason for call. If symptomatic, describe symptoms. You must click the next button to save text entered. ---Caller states, he is having chest pain and has been having chest pain for the last 3 days. It is a steady pain. Caller denies any other symptoms just discomfort. Has the patient traveled out of the country within the last 30 days? ---Not Applicable Does the patient have any new or worsening symptoms? ---Yes Will a triage be completed? ---Yes Related visit to physician within the last 2 weeks? ---No Does the PT have any chronic conditions? (i.e. diabetes, asthma, etc.) ---Yes List chronic conditions. 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 ---No Guidelines Guideline Title Affirmed Question Affirmed Notes Chest Pain [1] Chest pain lasts > 5 minutes AND [2] age > 81 Final Disposition User Call EMS 911 Now Hensel, RN, Aeriel Comments Caller requesting an appt to see his doctor instead. Nurse contacted backline per client directives. Disagree/Comply: Disagree Disagree/Comply Reason: Disagree with instructions

## 2016-04-05 NOTE — Patient Instructions (Signed)
Angina Pectoris Angina pectoris is a very bad feeling in the chest, neck, or arm. Your doctor may call it angina. There are four types of angina. Angina is caused by a lack of blood in the middle and thickest layer of the heart wall (myocardium). Angina may feel like a crushing or squeezing pain in the chest. It may feel like tightness or heavy pressure in the chest. Some people say it feels like gas, heartburn, or indigestion. Some people have symptoms other than pain. These include:  Shortness of breath.  Cold sweats.  Feeling sick to your stomach (nausea).  Feeling light-headed. Many women have chest discomfort and some of the other symptoms. However, women often have different symptoms, such as:  Feeling tired (fatigue).  Feeling nervous for no reason.  Feeling weak for no reason.  Dizziness or fainting. Women may have angina without any symptoms. Follow these instructions at home:  Take medicines only as told by your doctor.  Take care of other health issues as told by your doctor. These include:  High blood pressure (hypertension).  Diabetes.  Follow a heart-healthy diet. Your doctor can help you to choose healthy food options and make changes.  Talk to your doctor to learn more about healthy cooking methods and use them. These include:  Roasting.  Grilling.  Broiling.  Baking.  Poaching.  Steaming.  Stir-frying.  Follow an exercise program approved by your doctor.  Keep a healthy weight. Lose weight as told by your doctor.  Rest when you are tired.  Learn to manage stress.  Do not use any tobacco, such as cigarettes, chewing tobacco, or electronic cigarettes. If you need help quitting, ask your doctor.  If you drink alcohol, and your doctor says it is okay, limit yourself to no more than 1 drink per day. One drink equals 12 ounces of beer, 5 ounces of wine, or 1 ounces of hard liquor.  Stop illegal drug use.  Keep all follow-up visits as told by  your doctor. This is important. Do not take these medicines unless your doctor says that you can:  Nonsteroidal anti-inflammatory drugs (NSAIDs). These include:  Ibuprofen.  Naproxen.  Celecoxib.  Vitamin supplements that have vitamin A, vitamin E, or both.  Hormone therapy that contains estrogen with or without progestin. Get help right away if:  You have pain in your chest, neck, arm, jaw, stomach, or back that:  Lasts more than a few minutes.  Comes back.  Does not get better after you take medicine under your tongue (sublingual nitroglycerin).  You have any of these symptoms for no reason:  Gas, heartburn, or indigestion.  Sweating a lot.  Shortness of breath or trouble breathing.  Feeling sick to your stomach or throwing up.  Feeling more tired than usual.  Feeling nervous or worrying more than usual.  Feeling weak.  Diarrhea.  You are suddenly dizzy or light-headed.  You faint or pass out. These symptoms may be an emergency. Do not wait to see if the symptoms will go away. Get medical help right away. Call your local emergency services (911 in the [place] Do not drive yourself to the hospital.  This information is not intended to replace advice given to you by your health care provider. Make sure you discuss any questions you have with your health care provider. Document Released: 10/23/2007 Document Revised: 10/12/2015 Document Reviewed: 09/07/2013 Elsevier Interactive Patient Education  2017 [employer].  Nitroglycerin sublingual tablets What is this medicine? NITROGLYCERIN (nye troe GLI  ser in) is a type of vasodilator. It relaxes blood vessels, increasing the blood and oxygen supply to your heart. This medicine is used to relieve chest pain caused by angina. It is also used to prevent chest pain before activities like climbing stairs, going outdoors in cold weather, or sexual activity. This medicine may be used for other purposes; ask your health care  provider or pharmacist if you have questions. COMMON BRAND NAME(S): Nitroquick, Nitrostat, Nitrotab What should I tell my health care provider before I take this medicine? They need to know if you have any of these conditions: -anemia -head injury, recent stroke, or bleeding in the brain -liver disease -previous heart attack -an unusual or allergic reaction to nitroglycerin, other medicines, foods, dyes, or preservatives -pregnant or trying to get pregnant -breast-feeding How should I use this medicine? Take this medicine by mouth as needed. At the first sign of an angina attack (chest pain or tightness) place one tablet under your tongue. You can also take this medicine 5 to 10 minutes before an event likely to produce chest pain. Follow the directions on the prescription label. Let the tablet dissolve under the tongue. Do not swallow whole. Replace the dose if you accidentally swallow it. It will help if your mouth is not dry. Saliva around the tablet will help it to dissolve more quickly. Do not eat or drink, smoke or chew tobacco while a tablet is dissolving. If you are not better within 5 minutes after taking ONE dose of nitroglycerin, call 9-1-1 immediately to seek emergency medical care. Do not take more than 3 nitroglycerin tablets over 15 minutes. If you take this medicine often to relieve symptoms of angina, your doctor or health care professional may provide you with different instructions to manage your symptoms. If symptoms do not go away after following these instructions, it is important to call 9-1-1 immediately. Do not take more than 3 nitroglycerin tablets over 15 minutes. Talk to your pediatrician regarding the use of this medicine in children. Special care may be needed. Overdosage: If you think you have taken too much of this medicine contact a poison control center or emergency room at once. NOTE: This medicine is only for you. Do not share this medicine with others. What if I  miss a dose? This does not apply. This medicine is only used as needed. What may interact with this medicine? Do not take this medicine with any of the following medications: -certain migraine medicines like ergotamine and dihydroergotamine (DHE) -medicines used to treat erectile dysfunction like sildenafil, tadalafil, and vardenafil -riociguat This medicine may also interact with the following medications: -alteplase -aspirin -heparin -medicines for high blood pressure -medicines for mental depression -other medicines used to treat angina -phenothiazines like chlorpromazine, mesoridazine, prochlorperazine, thioridazine This list may not describe all possible interactions. Give your health care provider a list of all the medicines, herbs, non-prescription drugs, or dietary supplements you use. Also tell them if you smoke, drink alcohol, or use illegal drugs. Some items may interact with your medicine. What should I watch for while using this medicine? Tell your doctor or health care professional if you feel your medicine is no longer working. Keep this medicine with you at all times. Sit or lie down when you take your medicine to prevent falling if you feel dizzy or faint after using it. Try to remain calm. This will help you to feel better faster. If you feel dizzy, take several deep breaths and lie down with your  feet propped up, or bend forward with your head resting between your knees. You may get drowsy or dizzy. Do not drive, use machinery, or do anything that needs mental alertness until you know how this drug affects you. Do not stand or sit up quickly, especially if you are an older patient. This reduces the risk of dizzy or fainting spells. Alcohol can make you more drowsy and dizzy. Avoid alcoholic drinks. Do not treat yourself for coughs, colds, or pain while you are taking this medicine without asking your doctor or health care professional for advice. Some ingredients may increase  your blood pressure. What side effects may I notice from receiving this medicine? Side effects that you should report to your doctor or health care professional as soon as possible: -blurred vision -dry mouth -skin rash -sweating -the feeling of extreme pressure in the head -unusually weak or tired Side effects that usually do not require medical attention (report to your doctor or health care professional if they continue or are bothersome): -flushing of the face or neck -headache -irregular heartbeat, palpitations -nausea, vomiting This list may not describe all possible side effects. Call your doctor for medical advice about side effects. You may report side effects to FDA at 1-800-FDA-1088. Where should I keep my medicine? Keep out of the reach of children. Store at room temperature between 20 and 25 degrees C (68 and 77 degrees F). Store in [employer]. Protect from light and moisture. Keep tightly closed. Throw away any unused medicine after the expiration date. NOTE: This sheet is a summary. It may not cover all possible information. If you have questions about this medicine, talk to your doctor, pharmacist, or health care provider.  2017 Elsevier/[employer] Standard (2013-03-04 17:57:36)

## 2016-04-05 NOTE — Progress Notes (Signed)
Pre visit review using our clinic review tool, if applicable. No additional management support is needed unless otherwise documented below in the visit note. 

## 2016-04-05 NOTE — Telephone Encounter (Signed)
PCP was informed of team health call. Pt is scheduled with Wilfred Lacy, NP today at 3:00pm. Closing phone note.

## 2017-06-05 IMAGING — DX DG CHEST 2V
2 series · 2 of 2 positions shown · non-contrast
Comparison: None.

CLINICAL DATA: Lung nodule with intermittent fever.

EXAM:
CHEST  2 VIEW

[chest pa]
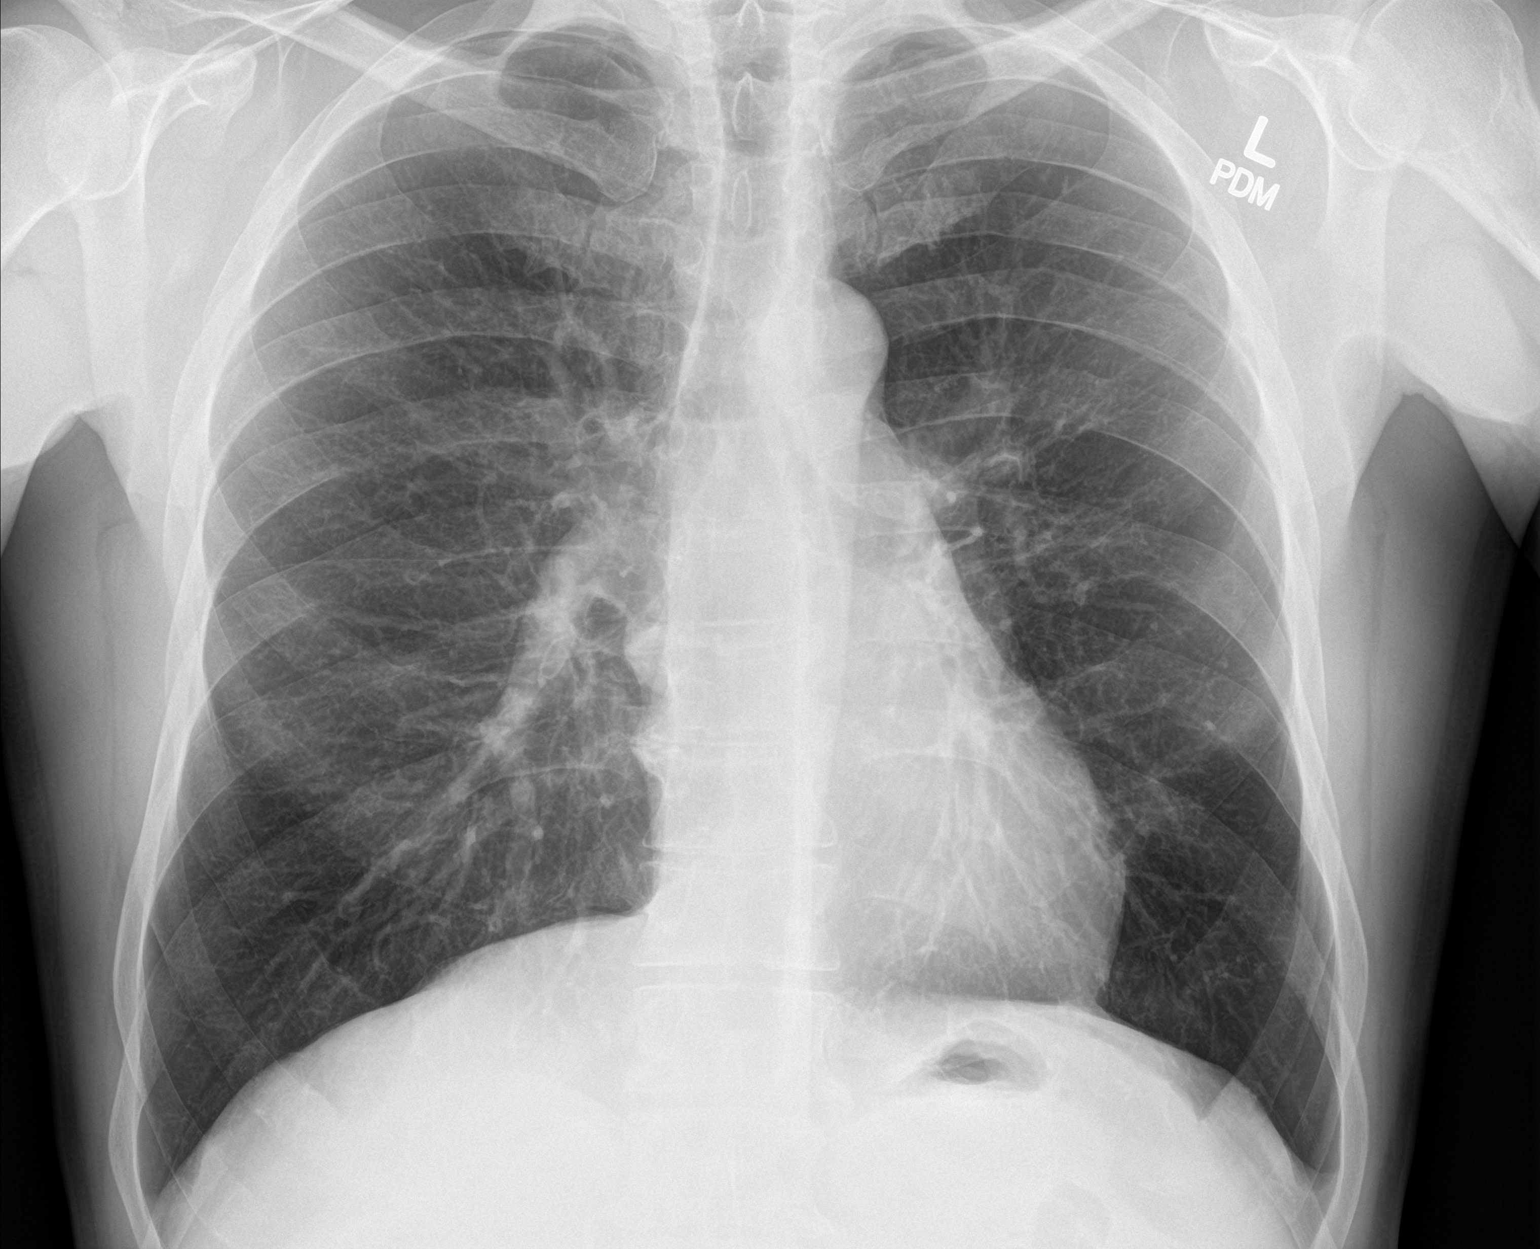

[chest lat]
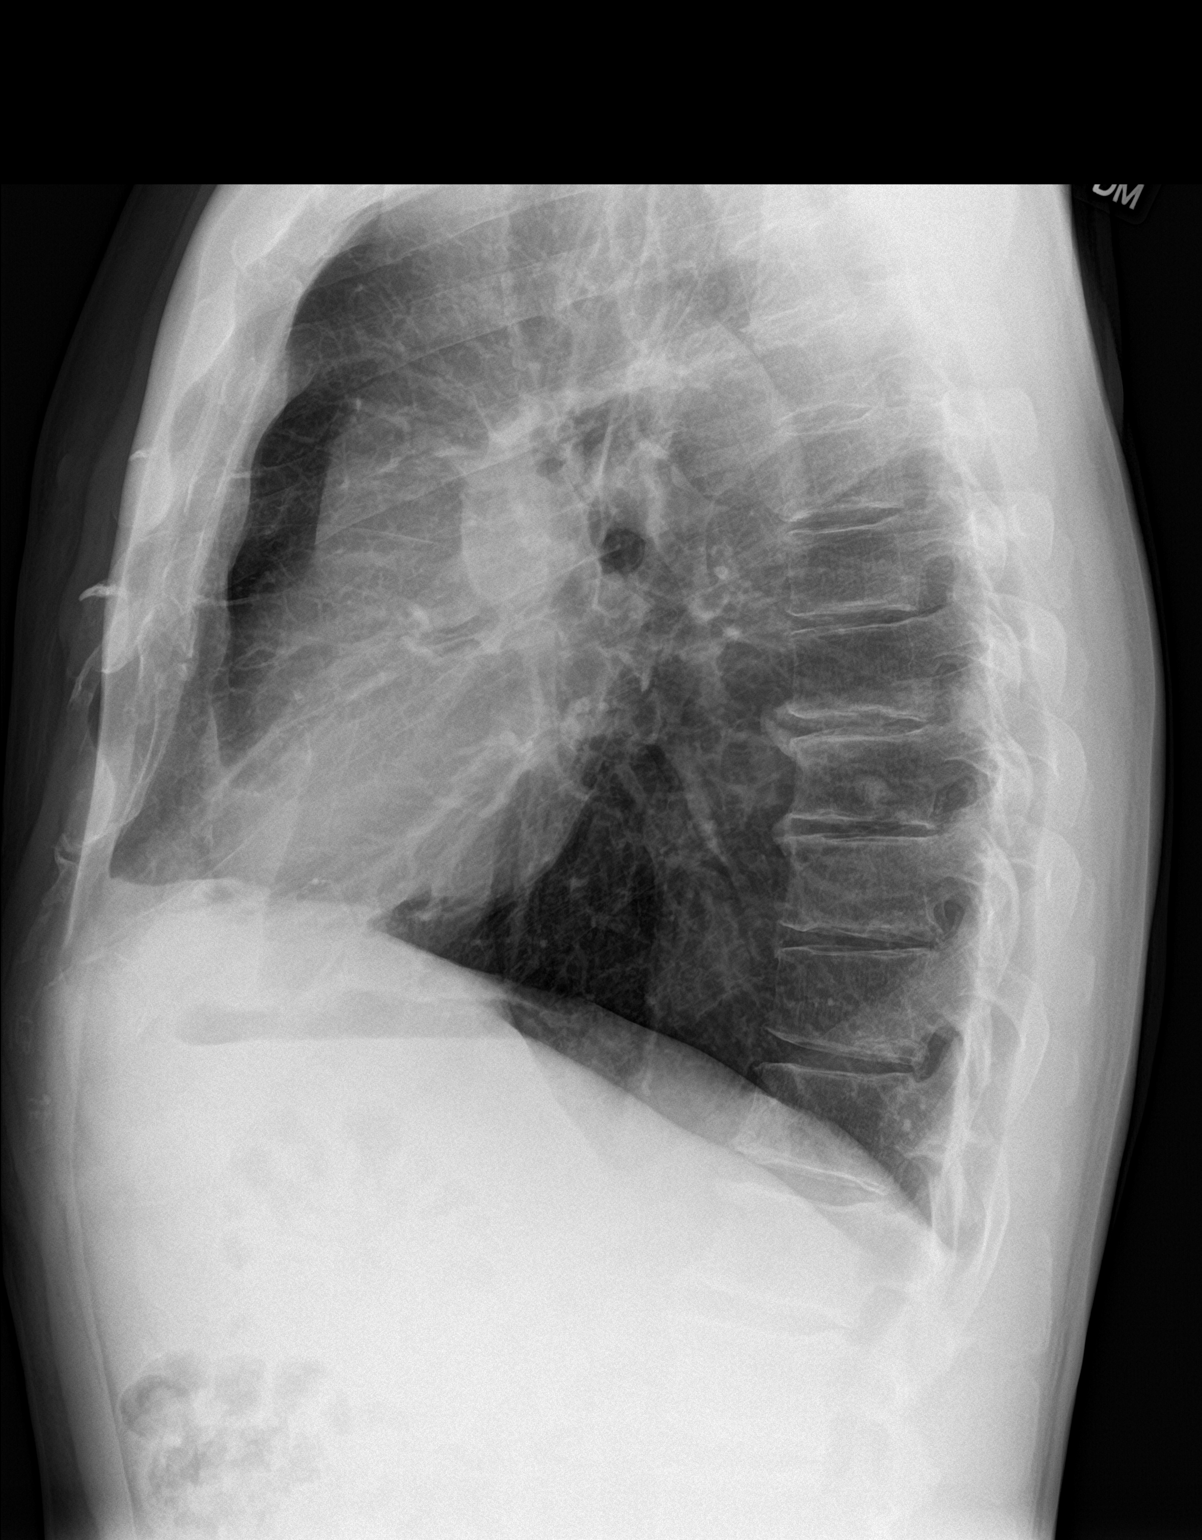

[2 of 2 positions shown; findings below may reference images not displayed]

FINDINGS: The lungs are clear wiithout focal pneumonia, edema, pneumothorax or
pleural effusion. Previous CT scan demonstrated a calcified nodule
in the posterior right upper lobe though is incompletely visualized.
On today's study, radiodense nodule projects over the T9 vertebral
body which would be compatible with the granuloma seen on previous
CT. Lungs are hyperexpanded. The cardiopericardial silhouette is
within normal limits for size. The visualized bony structures of the
thorax are intact.
IMPRESSION: Nodular density projecting over the T9 vertebral body on the lateral
film cannot be localized on the frontal projection, likely obscured
by vasculature in the hilum. This is compatible with a calcified
granuloma seen on the previous abdomen and pelvis CT.

Hyperinflation without acute cardiopulmonary process.
